# Patient Record
Sex: Female | Born: 1960 | Race: Black or African American | Hispanic: No | Smoking: Never smoker
Health system: Southern US, Community
[De-identification: ages and names within clinical notes are randomized; demographics above are authoritative.]

## PROBLEM LIST (undated history)

## (undated) DIAGNOSIS — D649 Anemia, unspecified: Secondary | ICD-10-CM

## (undated) DIAGNOSIS — I10 Essential (primary) hypertension: Secondary | ICD-10-CM

## (undated) DIAGNOSIS — I509 Heart failure, unspecified: Secondary | ICD-10-CM

## (undated) HISTORY — DX: Anemia, unspecified: D64.9

## (undated) HISTORY — DX: Heart failure, unspecified: I50.9

---

## 2004-11-17 ENCOUNTER — Emergency Department: Payer: Self-pay | Admitting: Emergency Medicine

## 2005-07-15 ENCOUNTER — Other Ambulatory Visit: Payer: Self-pay

## 2005-07-15 ENCOUNTER — Emergency Department: Payer: Self-pay | Admitting: Emergency Medicine

## 2005-07-31 ENCOUNTER — Emergency Department: Payer: Self-pay | Admitting: Emergency Medicine

## 2006-04-20 ENCOUNTER — Emergency Department: Payer: Self-pay | Admitting: General Practice

## 2006-06-10 ENCOUNTER — Ambulatory Visit: Payer: Self-pay | Admitting: Family Medicine

## 2007-02-14 ENCOUNTER — Emergency Department: Payer: Self-pay | Admitting: Emergency Medicine

## 2007-11-20 ENCOUNTER — Emergency Department: Payer: Self-pay | Admitting: Emergency Medicine

## 2009-05-11 ENCOUNTER — Ambulatory Visit: Payer: Self-pay | Admitting: Family Medicine

## 2009-06-11 ENCOUNTER — Emergency Department: Payer: Self-pay | Admitting: Emergency Medicine

## 2010-03-25 ENCOUNTER — Emergency Department: Payer: Self-pay | Admitting: Emergency Medicine

## 2010-07-14 ENCOUNTER — Emergency Department: Payer: Self-pay | Admitting: Emergency Medicine

## 2011-09-26 ENCOUNTER — Emergency Department: Payer: Self-pay | Admitting: Emergency Medicine

## 2011-09-26 LAB — BASIC METABOLIC PANEL
Anion Gap: 10 (ref 7–16)
BUN: 13 mg/dL (ref 7–18)
Co2: 26 mmol/L (ref 21–32)
EGFR (Non-African Amer.): 58 — ABNORMAL LOW
Osmolality: 280 (ref 275–301)

## 2011-09-26 LAB — CK TOTAL AND CKMB (NOT AT ARMC)
CK, Total: 155 U/L (ref 21–215)
CK, Total: 147 U/L (ref 21–215)
CK-MB: 1.3 ng/mL (ref 0.5–3.6)
CK-MB: 1 ng/mL (ref 0.5–3.6)

## 2011-09-26 LAB — CBC
HCT: 33.2 % — ABNORMAL LOW (ref 35.0–47.0)
HGB: 11.2 g/dL — ABNORMAL LOW (ref 12.0–16.0)
MCV: 92 fL (ref 80–100)
Platelet: 282 10*3/uL (ref 150–440)
RBC: 3.6 10*6/uL — ABNORMAL LOW (ref 3.80–5.20)

## 2012-10-29 ENCOUNTER — Ambulatory Visit: Payer: Self-pay | Admitting: Family Medicine

## 2013-06-03 ENCOUNTER — Ambulatory Visit: Payer: Self-pay | Admitting: Hematology and Oncology

## 2013-11-10 ENCOUNTER — Emergency Department: Payer: Self-pay | Admitting: Internal Medicine

## 2013-11-10 LAB — CBC
HCT: 30.6 % — ABNORMAL LOW (ref 35.0–47.0)
HGB: 10 g/dL — ABNORMAL LOW (ref 12.0–16.0)
MCH: 30.4 pg (ref 26.0–34.0)
MCHC: 32.5 g/dL (ref 32.0–36.0)
MCV: 94 fL (ref 80–100)
Platelet: 294 10*3/uL (ref 150–440)
RBC: 3.28 10*6/uL — ABNORMAL LOW (ref 3.80–5.20)
RDW: 15.1 % — AB (ref 11.5–14.5)
WBC: 7.4 10*3/uL (ref 3.6–11.0)

## 2013-11-10 LAB — BASIC METABOLIC PANEL
Anion Gap: 7 (ref 7–16)
BUN: 11 mg/dL (ref 7–18)
Calcium, Total: 8.9 mg/dL (ref 8.5–10.1)
Chloride: 104 mmol/L (ref 98–107)
Co2: 26 mmol/L (ref 21–32)
Creatinine: 1.22 mg/dL (ref 0.60–1.30)
EGFR (African American): 59 — ABNORMAL LOW
GFR CALC NON AF AMER: 51 — AB
Glucose: 90 mg/dL (ref 65–99)
OSMOLALITY: 273 (ref 275–301)
POTASSIUM: 3.5 mmol/L (ref 3.5–5.1)
Sodium: 137 mmol/L (ref 136–145)

## 2013-11-10 LAB — URINALYSIS, COMPLETE
BILIRUBIN, UR: NEGATIVE
BLOOD: NEGATIVE
GLUCOSE, UR: NEGATIVE mg/dL (ref 0–75)
KETONE: NEGATIVE
LEUKOCYTE ESTERASE: NEGATIVE
Nitrite: NEGATIVE
Ph: 6 (ref 4.5–8.0)
Protein: NEGATIVE
RBC, UR: NONE SEEN /HPF (ref 0–5)
Specific Gravity: 1.015 (ref 1.003–1.030)
Squamous Epithelial: 6
WBC UR: 1 /HPF (ref 0–5)

## 2013-11-10 LAB — TROPONIN I: Troponin-I: <0.02 ng/mL

## 2013-12-25 ENCOUNTER — Emergency Department: Payer: Self-pay | Admitting: Emergency Medicine

## 2014-01-01 ENCOUNTER — Emergency Department: Payer: Self-pay | Admitting: Emergency Medicine

## 2014-01-03 LAB — BETA STREP CULTURE(ARMC)

## 2015-10-09 ENCOUNTER — Ambulatory Visit
Admission: RE | Admit: 2015-10-09 | Discharge: 2015-10-09 | Disposition: A | Discharge status: Home / Self Care | Payer: Self-pay | Source: Ambulatory Visit | Attending: Family Medicine | Admitting: Family Medicine

## 2015-10-09 ENCOUNTER — Other Ambulatory Visit (HOSPITAL_COMMUNITY): Payer: Self-pay | Admitting: Family Medicine

## 2015-10-09 DIAGNOSIS — R7611 Nonspecific reaction to tuberculin skin test without active tuberculosis: Secondary | ICD-10-CM | POA: Insufficient documentation

## 2016-06-22 ENCOUNTER — Encounter: Payer: Self-pay | Admitting: Emergency Medicine

## 2016-06-22 DIAGNOSIS — J069 Acute upper respiratory infection, unspecified: Secondary | ICD-10-CM | POA: Insufficient documentation

## 2016-06-22 DIAGNOSIS — I10 Essential (primary) hypertension: Secondary | ICD-10-CM | POA: Insufficient documentation

## 2016-06-22 NOTE — ED Triage Notes (Addendum)
Patient with complaint of cough, congestion and chills that started yesterday. Patient states that she did not take her blood pressure medications today.

## 2016-06-23 ENCOUNTER — Emergency Department
Admission: EM | Admit: 2016-06-23 | Discharge: 2016-06-23 | Disposition: A | Discharge status: Home / Self Care | Payer: Self-pay | Attending: Emergency Medicine | Admitting: Emergency Medicine

## 2016-06-23 DIAGNOSIS — J069 Acute upper respiratory infection, unspecified: Secondary | ICD-10-CM

## 2016-06-23 HISTORY — DX: Essential (primary) hypertension: I10

## 2016-06-23 MED ORDER — HYDROCOD POLST-CPM POLST ER 10-8 MG/5ML PO SUER
5.0000 mL | Freq: Once | ORAL | Status: AC
  Administered 2016-06-23: 5 mL via ORAL
  Filled 2016-06-23: qty 5

## 2016-06-23 MED ORDER — GUAIFENESIN-CODEINE 100-10 MG/5ML PO SOLN: 5.0000 mL | Freq: Four times a day (QID) | ORAL | 0 refills | Status: DC | PRN

## 2016-06-23 NOTE — ED Provider Notes (Signed)
[name] Emergency Department Provider Note  Time seen: 1:15 AM  I have reviewed the triage vital signs and the nursing notes.   HISTORY  Chief Complaint Cough; Chills; Generalized Body Aches; and Nasal Congestion    HPI Sara Reed is a 56 y.o. female with a past medical history of hypertension who presents to the emergency department with cough and congestion. According to the patient for the past 2 days she has been coughing, states tonight she has not been able to get any sleep due to the cough. States some mild discomfort in the lower ribs from coughing so much. Patient denies fever at home. Denies any trouble breathing. Patient states nasal congestion in addition to cough. States some body aches.  Past Medical History:  Diagnosis Date  . Hypertension     There are no active problems to display for this patient.   History reviewed. No pertinent surgical history.  Prior to Admission medications   Not on File    No Known Allergies  No family history on file.  Social History Social History  Substance Use Topics  . Smoking status: Never Smoker  . Smokeless tobacco: Never Used  . Alcohol use No    Review of Systems Constitutional: Negative for fever. ENT: Positive for congestion Cardiovascular: Negative for chest pain. Respiratory: Negative for shortness of breath. Positive for cough. Gastrointestinal: Negative for abdominal pain Neurological: Negative for headache 10-point ROS otherwise negative.  ____________________________________________   PHYSICAL EXAM:  VITAL SIGNS: ED Triage Vitals  Enc Vitals Group     BP 06/22/16 2334 (!) 182/105     Pulse Rate 06/22/16 2334 100     Resp 06/22/16 2334 18     Temp 06/22/16 2332 98.4 F (36.9 C)     Temp Source 06/22/16 2332 Oral     SpO2 06/22/16 2334 99 %     Weight 06/22/16 2331 211 lb (95.7 kg)     Height 06/22/16 2331 5\' 7"  (1.702 m)     Head Circumference --      Peak  Flow --      Pain Score 06/22/16 2332 10     Pain Loc --      Pain Edu? --      Excl. in GC? --     Constitutional: Alert and oriented. Well appearing and in no distress. Eyes: Normal exam ENT   Head: Normocephalic and atraumatic.   Mouth/Throat: Mucous membranes are moist. Cardiovascular: Normal rate, regular rhythm. No murmur Respiratory: Normal respiratory effort without tachypnea nor retractions. Breath sounds are clear  Gastrointestinal: Soft and nontender. No distention.   Musculoskeletal: Nontender with normal range of motion in all extremities.  Neurologic:  Normal speech and language. No gross focal neurologic deficits Skin:  Skin is warm, dry and intact.  Psychiatric: Mood and affect are normal.  ____________________________________________    INITIAL IMPRESSION / ASSESSMENT AND PLAN / ED COURSE  Pertinent labs & imaging results that were available during my care of the patient were reviewed by me and considered in my medical decision making (see chart for details).  Patient presents with symptoms of an upper respiratory infection likely viral illness. Patient's main concern is not being able to sleep due to cough. Patient has clear lung sounds, largely normal vitals in the emergency department. We will treat with Tussionex, and discharged with a codeine based cough medication. I discussed supportive care at home including Tylenol or Motrin as needed for any fever or discomfort  in addition to plenty of rest and fluids. Patient is agreeable to plan we'll follow up with a primary care doctor if needed in 2-3 days.  ____________________________________________   FINAL CLINICAL IMPRESSION(S) / ED DIAGNOSES  Upper Respiratory infection    Minna AntisKevin Fatime [place], MD 06/23/16 [phone]

## 2016-06-26 ENCOUNTER — Ambulatory Visit: Payer: Self-pay

## 2016-07-31 ENCOUNTER — Ambulatory Visit: Payer: Self-pay | Attending: Oncology

## 2016-09-04 ENCOUNTER — Emergency Department: Payer: Self-pay

## 2016-09-04 ENCOUNTER — Encounter: Payer: Self-pay | Admitting: Emergency Medicine

## 2016-09-04 ENCOUNTER — Emergency Department
Admission: EM | Admit: 2016-09-04 | Discharge: 2016-09-04 | Disposition: A | Discharge status: Home / Self Care | Payer: Self-pay | Attending: Emergency Medicine | Admitting: Emergency Medicine

## 2016-09-04 DIAGNOSIS — I10 Essential (primary) hypertension: Secondary | ICD-10-CM | POA: Insufficient documentation

## 2016-09-04 DIAGNOSIS — R0981 Nasal congestion: Secondary | ICD-10-CM | POA: Insufficient documentation

## 2016-09-04 DIAGNOSIS — J189 Pneumonia, unspecified organism: Secondary | ICD-10-CM | POA: Insufficient documentation

## 2016-09-04 MED ORDER — HYDROCOD POLST-CPM POLST ER 10-8 MG/5ML PO SUER: 5.0000 mL | Freq: Two times a day (BID) | ORAL | 0 refills | Status: DC | PRN

## 2016-09-04 MED ORDER — AZITHROMYCIN 250 MG PO TABS: ORAL_TABLET | ORAL | 0 refills | Status: AC

## 2016-09-04 MED ORDER — BENZONATATE 100 MG PO CAPS
200.0000 mg | ORAL_CAPSULE | Freq: Once | ORAL | Status: AC
  Administered 2016-09-04: 200 mg via ORAL
  Filled 2016-09-04: qty 2

## 2016-09-04 MED ORDER — AZITHROMYCIN 500 MG PO TABS
500.0000 mg | ORAL_TABLET | Freq: Once | ORAL | Status: AC
  Administered 2016-09-04: 500 mg via ORAL
  Filled 2016-09-04: qty 1

## 2016-09-04 NOTE — ED Triage Notes (Signed)
Patient ambulatory to triage with steady gait, without difficulty or distress noted; pt reports since Saturday has had cold symptoms; prod cough green sputum and congestion

## 2016-09-04 NOTE — ED Provider Notes (Addendum)
[facility] Emergency Department Provider Note    First MD Initiated Contact with Patient 09/04/16 [phone]     (approximate)  I have reviewed the triage vital signs and the nursing notes.   HISTORY  Chief Complaint Cough and Nasal Congestion    HPI Sara Reed is a 56 y.o. female presents with three-day history of productive cough (green sputum), subjective fevers and chills. Patient denies any chest discomfort. Patient does admit that she has been unable to sleep secondary to cough. Patient denies any known sick contact. Patient denies any previous history of pneumonia.   Past Medical History:  Diagnosis Date  . Hypertension     There are no active problems to display for this patient.   History reviewed. No pertinent surgical history.  Prior to Admission medications   Medication Sig Start Date End Date Taking? Authorizing Provider  guaiFENesin-codeine 100-10 MG/5ML syrup Take 5 mLs by mouth every 6 (six) hours as needed for cough. 06/23/16   Minna Antis, MD    Allergies Patient has no known allergies.  No family history on file.  Social History Social History  Substance Use Topics  . Smoking status: Never Smoker  . Smokeless tobacco: Never Used  . Alcohol use No    Review of Systems Constitutional: No fever/chills Eyes: No visual changes. ENT: No sore throat. Cardiovascular: Denies chest pain. Respiratory: Denies shortness of breath.Positive for cough Gastrointestinal: No abdominal pain.  No nausea, no vomiting.  No diarrhea.  No constipation. Genitourinary: Negative for dysuria. Musculoskeletal: Negative for back pain. Integumentary: Negative for rash. Neurological: Negative for headaches, focal weakness or numbness.   ____________________________________________   PHYSICAL EXAM:  VITAL SIGNS: ED Triage Vitals  Enc Vitals Group     BP 09/04/16 0326 (!) 197/100     Pulse Rate 09/04/16 0326 (!) 108     Resp  09/04/16 0326 20     Temp 09/04/16 0326 99.1 F (37.3 C)     Temp Source 09/04/16 0326 Oral     SpO2 09/04/16 0326 95 %     Weight 09/04/16 0325 212 lb (96.2 kg)     Height 09/04/16 0325 5\' 3"  (1.6 m)     Head Circumference --      Peak Flow --      Pain Score --      Pain Loc --      Pain Edu? --      Excl. in GC? --     Constitutional: Alert and oriented. Well appearing and in no acute distress.Actively coughing Eyes: Conjunctivae are normal. PERRL. EOMI. Head: Atraumatic. Mouth/Throat: Mucous membranes are moist.  Oropharynx non-erythematous. Neck: No stridor.   Cardiovascular: Normal rate, regular rhythm. Good peripheral circulation. Grossly normal heart sounds. Respiratory: Normal respiratory effort.  No retractions. Bibasilar rhonchi Gastrointestinal: Soft and nontender. No distention.  Musculoskeletal: No lower extremity tenderness nor edema. No gross deformities of extremities. Neurologic:  Normal speech and language. No gross focal neurologic deficits are appreciated.  Skin:  Skin is warm, dry and intact. No rash noted. Psychiatric: Mood and affect are normal. Speech and behavior are normal.   RADIOLOGY I, [place] BROWN, personally viewed and evaluated these images (plain radiographs) as part of my medical decision making, as well as reviewing the written report by the radiologist.  Dg Chest 2 View  Result Date: 09/04/2016 CLINICAL DATA:  Acute onset of productive cough and congestion. Initial encounter. EXAM: CHEST  2 VIEW COMPARISON:  Chest radiograph  performed 10/09/2015 FINDINGS: The lungs are well-aerated. Hazy bibasilar airspace opacities raise concern for pneumonia. No pleural effusion or pneumothorax is seen. The heart is normal in size; the mediastinal contour is within normal limits. No acute osseous abnormalities are seen. IMPRESSION: Hazy bibasilar airspace opacities raise concern for pneumonia. Electronically Signed   By: Roanna RaiderJeffery  Chang M.D.   On: 09/04/2016  03:47    ____________________________________________    Procedures   ____________________________________________   INITIAL IMPRESSION / ASSESSMENT AND PLAN / ED COURSE  Pertinent labs & imaging results that were available during my care of the patient were reviewed by me and considered in my medical decision making (see chart for details).  56 year old female presenting with productive cough fever concern for possible pneumonia such chest x-ray was performed which revealed bibasilar pneumonia. Patient given azithromycin and Tessalon in the emergency department will be prescribed same for home. Patient advised to return to the emergency department for any worsening of symptoms.      ____________________________________________  FINAL CLINICAL IMPRESSION(S) / ED DIAGNOSES  Final diagnoses:  Community acquired pneumonia, unspecified laterality     MEDICATIONS GIVEN DURING THIS VISIT:  Medications  benzonatate (TESSALON) capsule 200 mg (200 mg Oral Given 09/04/16 0636)  azithromycin (ZITHROMAX) tablet 500 mg (500 mg Oral Given 09/04/16 0636)     NEW OUTPATIENT MEDICATIONS STARTED DURING THIS VISIT:  New Prescriptions   No medications on file    Modified Medications   No medications on file    Discontinued Medications   No medications on file     Note:  This document was prepared using Dragon voice recognition software and may include unintentional dictation errors.    Darci CurrentBrown, [place] N, MD 09/04/16 [id number]    Darci CurrentBrown, [place] N, MD 09/04/16 [phone]

## 2017-06-22 ENCOUNTER — Other Ambulatory Visit: Payer: Self-pay

## 2017-06-22 ENCOUNTER — Inpatient Hospital Stay
Admission: EM | Admit: 2017-06-22 | Discharge: 2017-06-24 | DRG: 193 | Disposition: A | Discharge status: Home / Self Care | Payer: Self-pay | Attending: Internal Medicine | Admitting: Internal Medicine

## 2017-06-22 ENCOUNTER — Emergency Department: Payer: Self-pay

## 2017-06-22 ENCOUNTER — Encounter: Payer: Self-pay | Admitting: Emergency Medicine

## 2017-06-22 DIAGNOSIS — J9601 Acute respiratory failure with hypoxia: Secondary | ICD-10-CM | POA: Diagnosis present

## 2017-06-22 DIAGNOSIS — I160 Hypertensive urgency: Secondary | ICD-10-CM | POA: Diagnosis present

## 2017-06-22 DIAGNOSIS — R918 Other nonspecific abnormal finding of lung field: Secondary | ICD-10-CM

## 2017-06-22 DIAGNOSIS — E876 Hypokalemia: Secondary | ICD-10-CM | POA: Diagnosis not present

## 2017-06-22 DIAGNOSIS — Z201 Contact with and (suspected) exposure to tuberculosis: Secondary | ICD-10-CM | POA: Diagnosis present

## 2017-06-22 DIAGNOSIS — J129 Viral pneumonia, unspecified: Principal | ICD-10-CM | POA: Diagnosis present

## 2017-06-22 DIAGNOSIS — R0902 Hypoxemia: Secondary | ICD-10-CM

## 2017-06-22 DIAGNOSIS — R042 Hemoptysis: Secondary | ICD-10-CM

## 2017-06-22 DIAGNOSIS — Z7982 Long term (current) use of aspirin: Secondary | ICD-10-CM

## 2017-06-22 DIAGNOSIS — I10 Essential (primary) hypertension: Secondary | ICD-10-CM | POA: Diagnosis present

## 2017-06-22 LAB — SEDIMENTATION RATE: Sed Rate: 69 mm/hr — ABNORMAL HIGH (ref 0–30)

## 2017-06-22 LAB — COMPREHENSIVE METABOLIC PANEL
ALK PHOS: 80 U/L (ref 38–126)
ALT: 15 U/L (ref 14–54)
AST: 23 U/L (ref 15–41)
Albumin: 3.9 g/dL (ref 3.5–5.0)
Anion gap: 9 (ref 5–15)
BUN: 11 mg/dL (ref 6–20)
CALCIUM: 9.2 mg/dL (ref 8.9–10.3)
CO2: 23 mmol/L (ref 22–32)
Chloride: 107 mmol/L (ref 101–111)
Creatinine, Ser: 0.98 mg/dL (ref 0.44–1.00)
Glucose, Bld: 116 mg/dL — ABNORMAL HIGH (ref 65–99)
Potassium: 3.6 mmol/L (ref 3.5–5.1)
SODIUM: 139 mmol/L (ref 135–145)
Total Bilirubin: 0.6 mg/dL (ref 0.3–1.2)
Total Protein: 8.4 g/dL — ABNORMAL HIGH (ref 6.5–8.1)

## 2017-06-22 LAB — RESPIRATORY PANEL BY PCR

## 2017-06-22 LAB — URINALYSIS, COMPLETE (UACMP) WITH MICROSCOPIC
Bacteria, UA: NONE SEEN
Bilirubin Urine: NEGATIVE
Glucose, UA: NEGATIVE mg/dL
Hgb urine dipstick: NEGATIVE
Ketones, ur: NEGATIVE mg/dL
Leukocytes, UA: NEGATIVE
Nitrite: NEGATIVE
Protein, ur: NEGATIVE mg/dL
RBC / HPF: NONE SEEN RBC/hpf (ref 0–5)
Specific Gravity, Urine: 1.025 (ref 1.005–1.030)
WBC, UA: NONE SEEN WBC/hpf (ref 0–5)
pH: 7 (ref 5.0–8.0)

## 2017-06-22 LAB — EXPECTORATED SPUTUM ASSESSMENT W GRAM STAIN, RFLX TO RESP C

## 2017-06-22 LAB — CBC WITH DIFFERENTIAL/PLATELET
Basophils Absolute: 0.1 10*3/uL (ref 0–0.1)
Basophils Relative: 2 %
EOS ABS: 0.1 10*3/uL (ref 0–0.7)
Eosinophils Relative: 2 %
HCT: 35.4 % (ref 35.0–47.0)
HEMOGLOBIN: 12 g/dL (ref 12.0–16.0)
LYMPHS ABS: 1.6 10*3/uL (ref 1.0–3.6)
Lymphocytes Relative: 28 %
MCH: 30 pg (ref 26.0–34.0)
MCHC: 33.9 g/dL (ref 32.0–36.0)
MCV: 88.5 fL (ref 80.0–100.0)
MONOS PCT: 5 %
Monocytes Absolute: 0.3 10*3/uL (ref 0.2–0.9)
NEUTROS PCT: 63 %
Neutro Abs: 3.6 10*3/uL (ref 1.4–6.5)
Platelets: 316 10*3/uL (ref 150–440)
RBC: 4 MIL/uL (ref 3.80–5.20)
RDW: 16 % — ABNORMAL HIGH (ref 11.5–14.5)
WBC: 5.6 10*3/uL (ref 3.6–11.0)

## 2017-06-22 LAB — EXPECTORATED SPUTUM ASSESSMENT W REFEX TO RESP CULTURE

## 2017-06-22 LAB — TROPONIN I: Troponin I: <0.03 ng/mL (ref <0.03)

## 2017-06-22 LAB — C-REACTIVE PROTEIN: CRP: <0.8 mg/dL (ref <1.0)

## 2017-06-22 LAB — BRAIN NATRIURETIC PEPTIDE: B Natriuretic Peptide: 81 pg/mL (ref 0.0–100.0)

## 2017-06-22 LAB — PROTIME-INR
INR: 0.98
PROTHROMBIN TIME: 12.9 s (ref 11.4–15.2)

## 2017-06-22 LAB — APTT: APTT: 32 s (ref 24–36)

## 2017-06-22 MED ORDER — SODIUM CHLORIDE 0.9 % IV SOLN
500.0000 mg | INTRAVENOUS | Status: DC
  Administered 2017-06-23: 500 mg via INTRAVENOUS
  Filled 2017-06-22: qty 500

## 2017-06-22 MED ORDER — FUROSEMIDE 10 MG/ML IJ SOLN
40.0000 mg | Freq: Once | INTRAMUSCULAR | Status: AC
  Administered 2017-06-22: 40 mg via INTRAVENOUS
  Filled 2017-06-22: qty 4

## 2017-06-22 MED ORDER — ONDANSETRON HCL 4 MG/2ML IJ SOLN: 4.0000 mg | Freq: Four times a day (QID) | INTRAMUSCULAR | Status: DC | PRN

## 2017-06-22 MED ORDER — LEVOFLOXACIN IN D5W 750 MG/150ML IV SOLN
750.0000 mg | Freq: Once | INTRAVENOUS | Status: AC
  Administered 2017-06-22: 750 mg via INTRAVENOUS
  Filled 2017-06-22: qty 150

## 2017-06-22 MED ORDER — POTASSIUM CHLORIDE 20 MEQ PO PACK
40.0000 meq | PACK | Freq: Once | ORAL | Status: AC
  Administered 2017-06-22: 40 meq via ORAL
  Filled 2017-06-22: qty 2

## 2017-06-22 MED ORDER — IPRATROPIUM-ALBUTEROL 0.5-2.5 (3) MG/3ML IN SOLN
3.0000 mL | Freq: Three times a day (TID) | RESPIRATORY_TRACT | Status: DC
  Administered 2017-06-22 – 2017-06-24 (×5): 3 mL via RESPIRATORY_TRACT
  Filled 2017-06-22 (×6): qty 3

## 2017-06-22 MED ORDER — HYDROCHLOROTHIAZIDE 25 MG PO TABS
25.0000 mg | ORAL_TABLET | Freq: Every day | ORAL | Status: DC
  Administered 2017-06-22 – 2017-06-24 (×3): 25 mg via ORAL
  Filled 2017-06-22 (×3): qty 1

## 2017-06-22 MED ORDER — SODIUM CHLORIDE 0.9 % IV SOLN
1.0000 g | INTRAVENOUS | Status: DC
  Administered 2017-06-23 – 2017-06-24 (×2): 1 g via INTRAVENOUS
  Filled 2017-06-22 (×2): qty 10

## 2017-06-22 MED ORDER — LISINOPRIL 20 MG PO TABS
20.0000 mg | ORAL_TABLET | Freq: Every day | ORAL | Status: DC
  Administered 2017-06-22 – 2017-06-24 (×3): 20 mg via ORAL
  Filled 2017-06-22 (×3): qty 1

## 2017-06-22 MED ORDER — ONDANSETRON HCL 4 MG PO TABS: 4.0000 mg | ORAL_TABLET | Freq: Four times a day (QID) | ORAL | Status: DC | PRN

## 2017-06-22 MED ORDER — IOPAMIDOL (ISOVUE-370) INJECTION 76%
75.0000 mL | Freq: Once | INTRAVENOUS | Status: AC | PRN
  Administered 2017-06-22: 75 mL via INTRAVENOUS

## 2017-06-22 MED ORDER — ORAL CARE MOUTH RINSE
15.0000 mL | Freq: Two times a day (BID) | OROMUCOSAL | Status: DC
  Administered 2017-06-22 (×2): 15 mL via OROMUCOSAL

## 2017-06-22 MED ORDER — BUDESONIDE 0.5 MG/2ML IN SUSP
0.5000 mg | Freq: Two times a day (BID) | RESPIRATORY_TRACT | Status: DC
Start: 2017-06-22 — End: 2017-06-24
  Administered 2017-06-22 – 2017-06-24 (×4): 0.5 mg via RESPIRATORY_TRACT
  Filled 2017-06-22 (×4): qty 2

## 2017-06-22 MED ORDER — ACETAMINOPHEN 325 MG PO TABS: 650.0000 mg | ORAL_TABLET | Freq: Four times a day (QID) | ORAL | Status: DC | PRN

## 2017-06-22 MED ORDER — SODIUM CHLORIDE 0.9 % IV SOLN
2.0000 g | Freq: Once | INTRAVENOUS | Status: DC
  Filled 2017-06-22: qty 2

## 2017-06-22 MED ORDER — HYDRALAZINE HCL 20 MG/ML IJ SOLN
10.0000 mg | INTRAMUSCULAR | Status: DC | PRN
  Administered 2017-06-22 – 2017-06-24 (×4): 10 mg via INTRAVENOUS
  Filled 2017-06-22 (×4): qty 1

## 2017-06-22 MED ORDER — HYDROCODONE-ACETAMINOPHEN 5-325 MG PO TABS
1.0000 | ORAL_TABLET | ORAL | Status: DC | PRN
Start: 2017-06-22 — End: 2017-06-24

## 2017-06-22 MED ORDER — ACETAMINOPHEN 650 MG RE SUPP: 650.0000 mg | Freq: Four times a day (QID) | RECTAL | Status: DC | PRN

## 2017-06-22 MED ORDER — BISOPROLOL-HYDROCHLOROTHIAZIDE 10-6.25 MG PO TABS
1.0000 | ORAL_TABLET | Freq: Every day | ORAL | Status: DC
  Administered 2017-06-22: 1 via ORAL
  Filled 2017-06-22 (×2): qty 1

## 2017-06-22 MED ORDER — POLYETHYLENE GLYCOL 3350 17 G PO PACK: 17.0000 g | PACK | Freq: Every day | ORAL | Status: DC | PRN

## 2017-06-22 MED ORDER — IPRATROPIUM-ALBUTEROL 0.5-2.5 (3) MG/3ML IN SOLN
3.0000 mL | Freq: Four times a day (QID) | RESPIRATORY_TRACT | Status: DC
  Administered 2017-06-22: 3 mL via RESPIRATORY_TRACT
  Filled 2017-06-22: qty 3

## 2017-06-22 MED ORDER — LISINOPRIL-HYDROCHLOROTHIAZIDE 20-25 MG PO TABS: 1.0000 | ORAL_TABLET | Freq: Every day | ORAL | Status: DC

## 2017-06-22 MED ORDER — BLOOD PRESSURE CONTROL BOOK: Freq: Once | Status: DC

## 2017-06-22 NOTE — Consult Note (Addendum)
[facility] Critical Care Medicine Consultation   ASSESSMENT/PLAN   Hemoptysis.   Scant hemoptysis noted with blood intermixed with sputum. CT scan and chest x-ray reviewed reveals bilateral predominantly alveolar process, lower lobe predominant and not bronchiolocentric. This makes diseases such as LCH less likely as patient is a nonsmoker and also no nodularity associated, hypersensitivity pneumonitis less likely, patient doesn't have demonstrable exposure, also distribution pattern on CT scan would be atypical for hypersensitivity, no crazy paving noted, less likely pulmonary alveolar proteinosis, alveolar sarcoid possible. No component of fibrosis is noted. Acute eosinophilic pneumonia can present with groundglass without peripheral eosinophilia. If no clear etiology at some point patient may require bronchoscopy with bronchoalveolar lavage for both culture data and quantitative cells.   In the setting of relative acute occurrence, no clear physical exam with findings consistent with congestive heart failure, no history of arthritis, no hematuria, no sinus disease is most likely infectious in nature with bilateral alveolar pneumonia. Would place on broad-spectrum coverage to include either macrolide or quinolone, respiratory viral panel, agree with ruling out for TB although much less likely to be tuberculous infection. Would obtain connective tissue workup to include ANA, ANCA, extractable nuclear antigens, rheumatoid factor, CCP, sedimentation rate, urinary sediment evaluating for casts, peripheral eosinophilia, quanteferon, echocardiography  Name: Sara Reed MRN: [id number] DOB: February 26, 1961    ADMISSION DATE:  06/22/2017 CONSULTATION DATE:  06/22/2017  REFERRING MD :  Dr. Katheren Shams  CHIEF COMPLAINT:  Coughing up blood   HISTORY OF PRESENT ILLNESS:  Sara Reed is a 57 year old female with past medical history remarkable for hypertension who has had illness for approximately 2 weeks off  time. 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 She has had cough over a two-week, she denied any fever, chills, sweats, denies any shortness of breath or chest pain. She denies any unintended weight loss, no pet or animal exposure or occupational exposures other than possibly sick contacts. This morning began coughing up streaks of blood mixed in with her sputum. Arrived to the emergency department, patient was hypoxemic, placed in isolation, started on supplemental oxygen. She feels well at this time is just concerned about her coughing up blood. Specifically on questioning patient does not have any bird exposure, denies any arthritis, states she does not have blood in her urine. Also denies sinus disease  PAST MEDICAL HISTORY :  Past Medical History:  Diagnosis Date  . Hypertension    History reviewed. No pertinent surgical history. Prior to Admission medications   Medication Sig Start Date End Date Taking? Authorizing Provider  aspirin EC 81 MG tablet Take 81 mg by mouth daily.   Yes [provider]   No Known Allergies  FAMILY HISTORY:  No family history on file. SOCIAL HISTORY:  reports that  has never smoked. she has never used smokeless tobacco. She reports that she does not drink alcohol or use drugs.  REVIEW OF SYSTEMS:   Constitutional: Feels well. Cardiovascular: No chest pain.  Pulmonary: Denies dyspnea.   The remainder of systems were reviewed and were found to be negative other than what is documented in the HPI.    VITAL SIGNS: Temp:  [98.4 F (36.9 C)] 98.4 F (36.9 C) (02/24 0725) Pulse Rate:  [88-114] 98 (02/24 1030) Resp:  [17-20] 17 (02/24 1030) BP: (178-212)/(109-128) 178/128 (02/24 1030) SpO2:  [86 %-100 %] 100 % (02/24 1030) Weight:  [90.7 kg (200 lb)] 90.7 kg (200 lb) (02/24 0726) HEMODYNAMICS:   INTAKE /  OUTPUT: No intake or output data in the 24 hours ending 06/22/17 1124  Physical Examination:   VS: BP (!)  178/128   Pulse 98   Temp 98.4 F (36.9 C) (Oral)   Resp 17   Ht 5\' 5"  (1.651 m)   Wt 90.7 kg (200 lb)   LMP 03/20/2017 (Approximate)   SpO2 100%   BMI 33.28 kg/m   General Appearance: No distress  Neuro:without focal findings, mental status, speech normal,. HEENT: PERRLA, EOM intact, no ptosis, no other lesions noticed;  Pulmonary: normal breath sounds., diaphragmatic excursion normal. CardiovascularNormal S1,S2.  No m/r/g.    Abdomen: Benign, Soft, non-tender, No masses, hepatosplenomegaly, No lymphadenopathy Skin:   warm, no rashes, no ecchymosis  Extremities: normal, no cyanosis, clubbing, no edema, warm with normal capillary refill.    LABS: Reviewed   LABORATORY PANEL:   CBC Recent Labs  Lab 06/22/17 0739  WBC 5.6  HGB 12.0  HCT 35.4  PLT 316    Chemistries  Recent Labs  Lab 06/22/17 0739  NA 139  K 3.6  CL 107  CO2 23  GLUCOSE 116*  BUN 11  CREATININE 0.98  CALCIUM 9.2  AST 23  ALT 15  ALKPHOS 80  BILITOT 0.6    No results for input(s): GLUCAP in the last 168 hours. No results for input(s): PHART, PCO2ART, PO2ART in the last 168 hours. Recent Labs  Lab 06/22/17 0739  AST 23  ALT 15  ALKPHOS 80  BILITOT 0.6  ALBUMIN 3.9    Cardiac Enzymes Recent Labs  Lab 06/22/17 0739  TROPONINI <0.03    RADIOLOGY:  Dg Chest 1 View  Result Date: 06/22/2017 CLINICAL DATA:  Cough and shortness of breath EXAM: CHEST 1 VIEW COMPARISON:  Sep 04, 2016 FINDINGS: Mild cardiomegaly. The hila and mediastinum are unchanged. Mild interstitial prominence suggests pulmonary venous congestion. No focal infiltrate or overt edema. IMPRESSION: Possible mild pulmonary venous congestion. No overt edema or focal infiltrate. Electronically Signed   By: Gerome Samavid  Williams III M.D   On: 06/22/2017 07:54   Ct Angio Chest Pe W And/or Wo Contrast  Result Date: 06/22/2017 CLINICAL DATA:  Hemoptysis.  Hypoxia. EXAM: CT ANGIOGRAPHY CHEST WITH CONTRAST TECHNIQUE: Multidetector CT  imaging of the chest was performed using the standard protocol during bolus administration of intravenous contrast. Multiplanar CT image reconstructions and MIPs were obtained to evaluate the vascular anatomy. CONTRAST:  75mL ISOVUE-370 IOPAMIDOL (ISOVUE-370) INJECTION 76% COMPARISON:  Chest radiograph from earlier today. FINDINGS: Cardiovascular: The study is high quality for the evaluation of pulmonary embolism. There are no filling defects in the central, lobar, segmental or subsegmental pulmonary artery branches to suggest acute pulmonary embolism. Great vessels are normal in course and caliber. Borderline mild cardiomegaly. No significant pericardial fluid/thickening. Mediastinum/Nodes: No discrete thyroid nodules. Unremarkable esophagus. No pathologically enlarged axillary, mediastinal or hilar lymph nodes. Coarsely calcified nonenlarged subcarinal and right hilar nodes from prior granulomatous disease. Lungs/Pleura: No pneumothorax. No pleural effusion. No acute consolidative airspace disease, lung masses or significant pulmonary nodules. Extensive patchy ground-glass opacities are noted throughout the bilateral dependent lungs, involving all lung lobes, most prominent in the lower lobes. Upper abdomen: Small hiatal hernia. Musculoskeletal: No aggressive appearing focal osseous lesions. Mild thoracic spondylosis. Review of the MIP images confirms the above findings. IMPRESSION: 1. No pulmonary embolism. 2. Extensive patchy ground-glass opacities throughout the dependent lungs bilaterally involving all lung lobes, most prominent in the lower lobes. Differential is broad and includes diffuse alveolar hemorrhage such as  from vasculitis, aspiration pneumonitis or pulmonary edema given the borderline mild cardiomegaly. 3. Small hiatal hernia. Electronically Signed   By: Delbert Phenix M.D.   On: 06/22/2017 09:42     Tora Kindred, DO  06/22/2017, 11:24 AM

## 2017-06-22 NOTE — ED Notes (Signed)
Tissue test completed-positive

## 2017-06-22 NOTE — ED Notes (Signed)
Returned from CT.

## 2017-06-22 NOTE — Progress Notes (Signed)
Patient's daughter brought in one of the patients home meds for review.  She said she takes another medicine that starts with a M.  I asked her if it was metoprolol and she said yes but she was not sure of the dose.  I asked her daughter if she could bring that bottle in tomorrow.  Home bottle brought in said zestoretic.  We are currently giving her ziac.  Dr Cherlynn KaiserSainani on call and notified.  He is changing her order here from ziac to zestorectic

## 2017-06-22 NOTE — H&P (Signed)
[employer] [facility] [place] [facility]   PATIENT NAME: Sara Reed    MR#:  [id number]  DATE OF BIRTH:  08-Dec-1960  DATE OF ADMISSION:  06/22/2017  PRIMARY CARE PHYSICIAN: Hillery Aldo, MD   REQUESTING/REFERRING PHYSICIAN:   CHIEF COMPLAINT:   Chief Complaint  Patient presents with  . Hemoptysis    HISTORY OF PRESENT ILLNESS: Sara Reed  is a 57 y.o. female with a known history per below, works as a Copy in the school system, presents with 1-2-week history of productive cough, developed coughing of blood this morning, ER workup noted for tachycardia, hypoxia, uncontrolled hypertension with systolic blood pressure in the 180s 190s range over 120, BNP was normal, CT chest noted for questionable hemorrhage/vasculitis/aspiration pneumonia versus edema, chest x-ray questionable for mild edema, ED attending did discuss case with Dr. Conforti/pulmonology-recommended broad-spectrum antibiotics and vasculitis workup, patient evaluated bedside, family present, patient denies having any fevers, chills, night sweats, rhinorrhea, sore throat, patient did have myalgias/joint aches for short time, does have a family history with a sister with asthma, but patient denies any wheezing/chest tightness, patient is now being admitted for acute hypoxic respiratory failure, hypertensive urgency, acute hemoptysis.  PAST MEDICAL HISTORY:   Past Medical History:  Diagnosis Date  . Hypertension     PAST SURGICAL HISTORY: None  SOCIAL HISTORY:  Social History   Tobacco Use  . Smoking status: Never Smoker  . Smokeless tobacco: Never Used  Substance Use Topics  . Alcohol use: No    FAMILY HISTORY: DM, II, asthma - sister  DRUG ALLERGIES: NKDA  REVIEW OF SYSTEMS:   CONSTITUTIONAL: No fever, fatigue or weakness.  EYES: No blurred or double vision.  EARS, NOSE, AND THROAT: No tinnitus or ear pain.  RESPIRATORY: + cough,no shortness of breath, wheezing or hemoptysis.   CARDIOVASCULAR: No chest pain, orthopnea, edema.  GASTROINTESTINAL: No nausea, vomiting, diarrhea or abdominal pain.  GENITOURINARY: No dysuria, hematuria.  ENDOCRINE: No polyuria, nocturia,  HEMATOLOGY: No anemia, easy bruising or bleeding SKIN: No rash or lesion. MUSCULOSKELETAL: No joint pain or arthritis.   NEUROLOGIC: No tingling, numbness, weakness.  PSYCHIATRY: No anxiety or depression.   MEDICATIONS AT HOME:  Prior to Admission medications   Not on File      PHYSICAL EXAMINATION:   VITAL SIGNS: Blood pressure (!) 186/121, pulse 88, temperature 98.4 F (36.9 C), temperature source Oral, resp. rate 17, height 5\' 5"  (1.651 m), weight 90.7 kg (200 lb), last menstrual period 03/20/2017, SpO2 96 %.  GENERAL:  57 y.o.-year-old patient lying in the bed with no acute distress.  EYES: Pupils equal, round, reactive to light and accommodation. No scleral icterus. Extraocular muscles intact.  HEENT: Head atraumatic, normocephalic. Oropharynx and nasopharynx clear.  NECK:  Supple, no jugular venous distention. No thyroid enlargement, no tenderness.  LUNGS: Inspiratory Rales/crepitus up to mid back. No use of accessory muscles of respiration.  CARDIOVASCULAR: S1, S2 normal. No murmurs, rubs, or gallops.  ABDOMEN: Soft, nontender, nondistended. Bowel sounds present. No organomegaly or mass.  EXTREMITIES: No pedal edema, cyanosis, or clubbing.  NEUROLOGIC: Cranial nerves II through XII are intact. Muscle strength 5/5 in all extremities. Sensation intact. Gait not checked.  PSYCHIATRIC: The patient is alert and oriented x 3.  SKIN: No obvious rash, lesion, or ulcer.   LABORATORY PANEL:   CBC Recent Labs  Lab 06/22/17 0739  WBC 5.6  HGB 12.0  HCT 35.4  PLT 316  MCV 88.5  MCH 30.0  MCHC 33.9  RDW 16.0*  LYMPHSABS 1.6  MONOABS 0.3  EOSABS 0.1  BASOSABS 0.1    ------------------------------------------------------------------------------------------------------------------  Chemistries  Recent Labs  Lab 06/22/17 0739  NA 139  K 3.6  CL 107  CO2 23  GLUCOSE 116*  BUN 11  CREATININE 0.98  CALCIUM 9.2  AST 23  ALT 15  ALKPHOS 80  BILITOT 0.6   ------------------------------------------------------------------------------------------------------------------ estimated creatinine clearance is 72.2 mL/min (by C-G formula based on SCr of 0.98 mg/dL). ------------------------------------------------------------------------------------------------------------------ No results for input(s): TSH, T4TOTAL, T3FREE, THYROIDAB in the last 72 hours.  Invalid input(s): FREET3   Coagulation profile Recent Labs  Lab 06/22/17 0739  INR 0.98   ------------------------------------------------------------------------------------------------------------------- No results for input(s): DDIMER in the last 72 hours. -------------------------------------------------------------------------------------------------------------------  Cardiac Enzymes Recent Labs  Lab 06/22/17 0739  TROPONINI <0.03   ------------------------------------------------------------------------------------------------------------------ Invalid input(s): POCBNP  ---------------------------------------------------------------------------------------------------------------  Urinalysis    Component Value Date/Time   COLORURINE Yellow 11/10/2013 1947   APPEARANCEUR Clear 11/10/2013 1947   LABSPEC 1.015 11/10/2013 1947   PHURINE 6.0 11/10/2013 1947   GLUCOSEU Negative 11/10/2013 1947   HGBUR Negative 11/10/2013 1947   BILIRUBINUR Negative 11/10/2013 1947   KETONESUR Negative 11/10/2013 1947   PROTEINUR Negative 11/10/2013 1947   NITRITE Negative 11/10/2013 1947   LEUKOCYTESUR Negative 11/10/2013 1947     RADIOLOGY: Dg Chest 1 View  Result Date:  06/22/2017 CLINICAL DATA:  Cough and shortness of breath EXAM: CHEST 1 VIEW COMPARISON:  Sep 04, 2016 FINDINGS: Mild cardiomegaly. The hila and mediastinum are unchanged. Mild interstitial prominence suggests pulmonary venous congestion. No focal infiltrate or overt edema. IMPRESSION: Possible mild pulmonary venous congestion. No overt edema or focal infiltrate. Electronically Signed   By: Gerome Sam III M.D   On: 06/22/2017 07:54   Ct Angio Chest Pe W And/or Wo Contrast  Result Date: 06/22/2017 CLINICAL DATA:  Hemoptysis.  Hypoxia. EXAM: CT ANGIOGRAPHY CHEST WITH CONTRAST TECHNIQUE: Multidetector CT imaging of the chest was performed using the standard protocol during bolus administration of intravenous contrast. Multiplanar CT image reconstructions and MIPs were obtained to evaluate the vascular anatomy. CONTRAST:  75mL ISOVUE-370 IOPAMIDOL (ISOVUE-370) INJECTION 76% COMPARISON:  Chest radiograph from earlier today. FINDINGS: Cardiovascular: The study is high quality for the evaluation of pulmonary embolism. There are no filling defects in the central, lobar, segmental or subsegmental pulmonary artery branches to suggest acute pulmonary embolism. Great vessels are normal in course and caliber. Borderline mild cardiomegaly. No significant pericardial fluid/thickening. Mediastinum/Nodes: No discrete thyroid nodules. Unremarkable esophagus. No pathologically enlarged axillary, mediastinal or hilar lymph nodes. Coarsely calcified nonenlarged subcarinal and right hilar nodes from prior granulomatous disease. Lungs/Pleura: No pneumothorax. No pleural effusion. No acute consolidative airspace disease, lung masses or significant pulmonary nodules. Extensive patchy ground-glass opacities are noted throughout the bilateral dependent lungs, involving all lung lobes, most prominent in the lower lobes. Upper abdomen: Small hiatal hernia. Musculoskeletal: No aggressive appearing focal osseous lesions. Mild thoracic  spondylosis. Review of the MIP images confirms the above findings. IMPRESSION: 1. No pulmonary embolism. 2. Extensive patchy ground-glass opacities throughout the dependent lungs bilaterally involving all lung lobes, most prominent in the lower lobes. Differential is broad and includes diffuse alveolar hemorrhage such as from vasculitis, aspiration pneumonitis or pulmonary edema given the borderline mild cardiomegaly. 3. Small hiatal hernia. Electronically Signed   By: Delbert Phenix M.D.   On: 06/22/2017 09:42    EKG: Orders placed or performed during the hospital encounter of 06/22/17  . ED EKG  . ED EKG  .  EKG 12-Lead  . EKG 12-Lead  . EKG 12-Lead  . EKG 12-Lead    IMPRESSION AND PLAN: 1 acute hypoxic respiratory failure Secondary to unknown etiology CT chest noted for wide with differential that includes hemorrhage/vasculitis, aspiration pneumonia, edema Admit to regular nursing floor bed, pulmonology consulted for expert opinion, empiric Rocephin/azithromycin, check vasculitis workup-ANA/CRP/ANCA/ANA/urinalysis, check viral respiratory panel, sputum cultures, trial of Pulmicort twice daily with scheduled breathing treatments, Lasix IV x1, supplemental oxygen with weaning as tolerated  2 acute hemoptysis Etiology unknown, compounded by hypertensive urgency Plan of care as stated above  3 acute hypertensive urgency Lasix IV x1 with potassium replacement, Ziac, hydralazine as needed systolic blood pressure greater than 160, vitals per routine, make changes as per necessary  4 acute abnormal CT of the chest Plan of care as stated above  Disposition Home in 1-2 days barring any complications   all the records are reviewed and case discussed with ED provider. Management plans discussed with the patient, family and they are in agreement.  CODE STATUS:full Code Status History    This patient does not have a recorded code status. Please follow your organizational policy for patients in  this situation.       TOTAL TIME TAKING CARE OF THIS PATIENT: 45 minutes.    Evelena AsaMontell D Salary M.D on 06/22/2017   Between 7am to 6pm - Pager - [phone]  After 6pm go to [email or web address] - password [email or web address]  [facility]  Office  [phone]  CC: Primary care physician; Hillery AldoPatel, Sarah, MD   Note: This dictation was prepared with Dragon dictation along with smaller phrase technology. Any transcriptional errors that result from this process are unintentional.

## 2017-06-22 NOTE — ED Triage Notes (Signed)
Pt reports cold sx's for the past week and started coughing up blood this am. Pt actively coughing in triage blood tinged sputum.

## 2017-06-22 NOTE — ED Provider Notes (Signed)
[facility] Emergency Department Provider Note  ___________________________________________   First MD Initiated Contact with Patient 06/22/17 [phone]     (approximate)  I have reviewed the triage vital signs and the nursing notes.   HISTORY  Chief Complaint Hemoptysis  HPI Sara Reed is a 57 y.o. female with a history of hypertension was presenting to the emergency department 2 weeks of cough.  She is denying any chest pain.  Denies any shortness of breath.  However, as of this morning she is coughing up pink, bloody sputum.  She says that she is compliant with her blood pressure medications.  Says that she also hears a "wheezing" from her chest.  Denies being on blood thinners.  Denies nosebleeds.  Past Medical History:  Diagnosis Date  . Hypertension     There are no active problems to display for this patient.   History reviewed. No pertinent surgical history.  Prior to Admission medications   Medication Sig Start Date End Date Taking? Authorizing Provider  chlorpheniramine-HYDROcodone (TUSSIONEX PENNKINETIC ER) 10-8 MG/5ML SUER Take 5 mLs by mouth every 12 (twelve) hours as needed. 09/04/16   Darci CurrentBrown, Reliance N, MD  guaiFENesin-codeine 100-10 MG/5ML syrup Take 5 mLs by mouth every 6 (six) hours as needed for cough. 06/23/16   Minna AntisPaduchowski, Kevin, MD    Allergies Patient has no known allergies.  No family history on file.  Social History Social History   Tobacco Use  . Smoking status: Never Smoker  . Smokeless tobacco: Never Used  Substance Use Topics  . Alcohol use: No  . Drug use: No    Review of Systems  Constitutional: No fever/chills Eyes: No visual changes. ENT: No sore throat. Cardiovascular: Denies chest pain. Respiratory: As above Gastrointestinal: No abdominal pain.  No nausea, no vomiting.  No diarrhea.  No constipation. Genitourinary: Negative for dysuria. Musculoskeletal: Negative for back pain. Skin: Negative for  rash. Neurological: Negative for headaches, focal weakness or numbness.   ____________________________________________   PHYSICAL EXAM:  VITAL SIGNS: ED Triage Vitals  Enc Vitals Group     BP 06/22/17 0725 (!) 212/109     Pulse Rate 06/22/17 0725 (!) 114     Resp 06/22/17 0725 20     Temp 06/22/17 0725 98.4 F (36.9 C)     Temp Source 06/22/17 0725 Oral     SpO2 06/22/17 0725 (!) 86 %     Weight 06/22/17 0726 200 lb (90.7 kg)     Height 06/22/17 0726 5\' 5"  (1.651 m)     Head Circumference --      Peak Flow --      Pain Score 06/22/17 0725 8     Pain Loc --      Pain Edu? --      Excl. in GC? --     Constitutional: Alert and oriented. Well appearing and in no acute distress. Eyes: Conjunctivae are normal.  Head: Atraumatic. Nose: No congestion/rhinnorhea.  No blood to the bilateral nares.  No obvious bleeding source visualized on the septum. Mouth/Throat: Mucous membranes are moist.  No blood in the posterior pharynx. Neck: No stridor.   Cardiovascular: Normal rate, regular rhythm. Grossly normal heart sounds.   Respiratory: Normal respiratory effort.  No retractions.  Mild rales from the bases to the mid fields bilaterally.  Patient coughed several times while I am in the room and produces a pink, blood-tinged sputum. Gastrointestinal: Soft and nontender. No distention.  Musculoskeletal: No lower extremity tenderness nor  edema.  No joint effusions. Neurologic:  Normal speech and language. No gross focal neurologic deficits are appreciated. Skin:  Skin is warm, dry and intact. No rash noted. Psychiatric: Mood and affect are normal. Speech and behavior are normal.  ____________________________________________   LABS (all labs ordered are listed, but only abnormal results are displayed)  Labs Reviewed  CBC WITH DIFFERENTIAL/PLATELET - Abnormal; Notable for the following components:      Result Value   RDW 16.0 (*)    All other components within normal limits    COMPREHENSIVE METABOLIC PANEL - Abnormal; Notable for the following components:   Glucose, Bld 116 (*)    Total Protein 8.4 (*)    All other components within normal limits  PROTIME-INR  APTT  TROPONIN I  BRAIN NATRIURETIC PEPTIDE   ____________________________________________  EKG  ED ECG REPORT I, Arelia Longest, the attending physician, personally viewed and interpreted this ECG.   Date: 06/22/2017  EKG Time: 0754  Rate: 101  Rhythm: sinus tachycardia  Axis: Normal  Intervals:none  ST&T Change: No ST segment elevation or depression.  No abnormal T wave inversion.  ____________________________________________  RADIOLOGY  Possible mild pulmonary venous congestion without overt edema or infiltrate  No PE.  CT shows extensive groundglass opacities.  Broad differential including diffuse alveolar hemorrhage, aspiration pneumonitis or pulmonary edema. ____________________________________________   PROCEDURES  Procedure(s) performed:   .Critical Care Performed by: Myrna Blazer, MD Authorized by: Myrna Blazer, MD   Critical care provider statement:    Critical care time (minutes):  35   Critical care was necessary to treat or prevent imminent or life-threatening deterioration of the following conditions:  Respiratory failure   Critical care was time spent personally by me on the following activities:  Examination of patient, ordering and performing treatments and interventions, ordering and review of laboratory studies, ordering and review of radiographic studies, pulse oximetry and re-evaluation of patient's condition    Critical Care performed:   ____________________________________________   INITIAL IMPRESSION / ASSESSMENT AND PLAN / ED COURSE  Pertinent labs & imaging results that were available during my care of the patient were reviewed by me and considered in my medical decision making (see chart for details).  Differential  includes, but is not limited to, viral syndrome, bronchitis including COPD exacerbation, pneumonia, reactive airway disease including asthma, CHF including exacerbation with or without pulmonary/interstitial edema, pneumothorax, ACS, thoracic trauma, and pulmonary embolism. Lupus, sarcoid, diffuse alveolar hemorrhage As part of my medical decision making, I reviewed the following data within the electronic MEDICAL RECORD NUMBER Notes from prior ED visits  ----------------------------------------- 10:26 AM on 06/22/2017 -----------------------------------------  I discussed the case with Dr. Lonn Georgia of pulmonary critical care.  He recommends antibiotics and lab panel for autoimmune/rheumatologic disorder.  He says that the pulmonary appearance is not consistent with tuberculosis.  I also discussed the possibility of TEE with the reading radiologist who says that the appearance is also not classic for TB.  Dr. Lonn Georgia also did not recommend steroids at this time.  Will be admitted to the hospitalist service.  Signed out to Dr. Katheren Shams.  Dr. Lonn Georgia requested antibiotics including quinolone or macrolide for atypical coverage.  ____________________________________________   FINAL CLINICAL IMPRESSION(S) / ED DIAGNOSES  Hypoxia.  Hemoptysis.  Ground glass opacities.    NEW MEDICATIONS STARTED DURING THIS VISIT:  New Prescriptions   No medications on file     Note:  This document was prepared using Dragon voice recognition software and may  include unintentional dictation errors.     Myrna Blazer, MD 06/22/17 1031

## 2017-06-23 ENCOUNTER — Other Ambulatory Visit: Payer: Self-pay | Admitting: *Deleted

## 2017-06-23 ENCOUNTER — Inpatient Hospital Stay
Admit: 2017-06-23 | Discharge: 2017-06-23 | Disposition: A | Discharge status: Home / Self Care | Payer: Self-pay | Attending: Family Medicine | Admitting: Family Medicine

## 2017-06-23 DIAGNOSIS — R918 Other nonspecific abnormal finding of lung field: Secondary | ICD-10-CM

## 2017-06-23 DIAGNOSIS — J129 Viral pneumonia, unspecified: Principal | ICD-10-CM

## 2017-06-23 LAB — CBC
HCT: 34.9 % — ABNORMAL LOW (ref 35.0–47.0)
HEMOGLOBIN: 11.6 g/dL — AB (ref 12.0–16.0)
MCH: 29.1 pg (ref 26.0–34.0)
MCHC: 33.2 g/dL (ref 32.0–36.0)
MCV: 87.6 fL (ref 80.0–100.0)
Platelets: 341 10*3/uL (ref 150–440)
RBC: 3.98 MIL/uL (ref 3.80–5.20)
RDW: 16 % — ABNORMAL HIGH (ref 11.5–14.5)
WBC: 7.5 10*3/uL (ref 3.6–11.0)

## 2017-06-23 LAB — URINE DRUG SCREEN, QUALITATIVE (ARMC ONLY)
Amphetamines, Ur Screen: NOT DETECTED
Barbiturates, Ur Screen: NOT DETECTED
Benzodiazepine, Ur Scrn: NOT DETECTED
Cannabinoid 50 Ng, Ur ~~LOC~~: NOT DETECTED
Cocaine Metabolite,Ur ~~LOC~~: NOT DETECTED
MDMA (Ecstasy)Ur Screen: NOT DETECTED
Methadone Scn, Ur: NOT DETECTED
Opiate, Ur Screen: NOT DETECTED
Phencyclidine (PCP) Ur S: NOT DETECTED
Tricyclic, Ur Screen: NOT DETECTED

## 2017-06-23 LAB — PATHOLOGIST SMEAR REVIEW

## 2017-06-23 LAB — RAPID HIV SCREEN (HIV 1/2 AB+AG)
HIV 1/2 Antibodies: NONREACTIVE
HIV-1 P24 Antigen - HIV24: NONREACTIVE

## 2017-06-23 MED ORDER — METHYLPREDNISOLONE SODIUM SUCC 125 MG IJ SOLR
60.0000 mg | Freq: Every day | INTRAMUSCULAR | Status: DC
  Administered 2017-06-23 – 2017-06-24 (×2): 60 mg via INTRAVENOUS
  Filled 2017-06-23 (×2): qty 2

## 2017-06-23 MED ORDER — AZITHROMYCIN 250 MG PO TABS
500.0000 mg | ORAL_TABLET | Freq: Every day | ORAL | Status: DC
  Administered 2017-06-24: 500 mg via ORAL
  Filled 2017-06-23: qty 2

## 2017-06-23 MED ORDER — SODIUM CHLORIDE 0.9% FLUSH
3.0000 mL | Freq: Two times a day (BID) | INTRAVENOUS | Status: DC
  Administered 2017-06-23 – 2017-06-24 (×2): 3 mL via INTRAVENOUS

## 2017-06-23 NOTE — Progress Notes (Signed)
[employer] [facility] [facility] [place] [facility]   PATIENT NAME: Sara Reed    MR#:  [id number]  DATE OF BIRTH:  Dec 29, 1960  SUBJECTIVE:  CHIEF COMPLAINT:   Chief Complaint  Patient presents with  . Hemoptysis   -Patient came in with coughing, dyspnea and hemoptysis. -Has minimal dry cough this morning. Her breathing has significantly improved. No further hemoptysis. -Airborne isolation has been placed to rule out tuberculosis.  REVIEW OF SYSTEMS:  Review of Systems  Constitutional: Negative for chills, fever and malaise/fatigue.  HENT: Negative for congestion, ear discharge, hearing loss and nosebleeds.   Eyes: Negative for blurred vision and double vision.  Respiratory: Positive for cough. Negative for shortness of breath and wheezing.   Cardiovascular: Negative for chest pain, palpitations and leg swelling.  Gastrointestinal: Negative for abdominal pain, constipation, diarrhea, nausea and vomiting.  Genitourinary: Negative for dysuria.  Musculoskeletal: Negative for myalgias.  Neurological: Negative for dizziness, speech change, focal weakness, seizures and headaches.  Psychiatric/Behavioral: Negative for depression.    DRUG ALLERGIES:  No Known Allergies  VITALS:  Blood pressure 129/84, pulse 84, temperature 98.1 F (36.7 C), temperature source Oral, resp. rate 14, height 5\' 5"  (1.651 m), weight 90.7 kg (200 lb), last menstrual period 03/20/2017, SpO2 99 %.  PHYSICAL EXAMINATION:  Physical Exam  GENERAL:  57 y.o.-year-old patient lying in the bed with no acute distress.  EYES: Pupils equal, round, reactive to light and accommodation. No scleral icterus. Extraocular muscles intact.  HEENT: Head atraumatic, normocephalic. Oropharynx and nasopharynx clear.  NECK:  Supple, no jugular venous distention. No thyroid enlargement, no tenderness.  LUNGS: Normal breath sounds bilaterally, no wheezing, rales or crepitation. Occasional scattered rhonchi at the bases. No  use of accessory muscles of respiration.  CARDIOVASCULAR: S1, S2 normal. No murmurs, rubs, or gallops.  ABDOMEN: Soft, nontender, nondistended. Bowel sounds present. No organomegaly or mass.  EXTREMITIES: No pedal edema, cyanosis, or clubbing.  NEUROLOGIC: Cranial nerves II through XII are intact. Muscle strength 5/5 in all extremities. Sensation intact. Gait not checked.  PSYCHIATRIC: The patient is alert and oriented x 3.  SKIN: No obvious rash, lesion, or ulcer.    LABORATORY PANEL:   CBC Recent Labs  Lab 06/23/17 0540  WBC 7.5  HGB 11.6*  HCT 34.9*  PLT 341   ------------------------------------------------------------------------------------------------------------------  Chemistries  Recent Labs  Lab 06/22/17 0739  NA 139  K 3.6  CL 107  CO2 23  GLUCOSE 116*  BUN 11  CREATININE 0.98  CALCIUM 9.2  AST 23  ALT 15  ALKPHOS 80  BILITOT 0.6   ------------------------------------------------------------------------------------------------------------------  Cardiac Enzymes Recent Labs  Lab 06/22/17 0739  TROPONINI <0.03   ------------------------------------------------------------------------------------------------------------------  RADIOLOGY:  Dg Chest 1 View  Result Date: 06/22/2017 CLINICAL DATA:  Cough and shortness of breath EXAM: CHEST 1 VIEW COMPARISON:  Sep 04, 2016 FINDINGS: Mild cardiomegaly. The hila and mediastinum are unchanged. Mild interstitial prominence suggests pulmonary venous congestion. No focal infiltrate or overt edema. IMPRESSION: Possible mild pulmonary venous congestion. No overt edema or focal infiltrate. Electronically Signed   By: Gerome Samavid  Williams III M.D   On: 06/22/2017 07:54   Ct Angio Chest Pe W And/or Wo Contrast  Result Date: 06/22/2017 CLINICAL DATA:  Hemoptysis.  Hypoxia. EXAM: CT ANGIOGRAPHY CHEST WITH CONTRAST TECHNIQUE: Multidetector CT imaging of the chest was performed using the standard protocol during bolus  administration of intravenous contrast. Multiplanar CT image reconstructions and MIPs were obtained to evaluate the vascular anatomy. CONTRAST:  75mL ISOVUE-370 IOPAMIDOL (ISOVUE-370) INJECTION 76% COMPARISON:  Chest radiograph from earlier today. FINDINGS: Cardiovascular: The study is high quality for the evaluation of pulmonary embolism. There are no filling defects in the central, lobar, segmental or subsegmental pulmonary artery branches to suggest acute pulmonary embolism. Great vessels are normal in course and caliber. Borderline mild cardiomegaly. No significant pericardial fluid/thickening. Mediastinum/Nodes: No discrete thyroid nodules. Unremarkable esophagus. No pathologically enlarged axillary, mediastinal or hilar lymph nodes. Coarsely calcified nonenlarged subcarinal and right hilar nodes from prior granulomatous disease. Lungs/Pleura: No pneumothorax. No pleural effusion. No acute consolidative airspace disease, lung masses or significant pulmonary nodules. Extensive patchy ground-glass opacities are noted throughout the bilateral dependent lungs, involving all lung lobes, most prominent in the lower lobes. Upper abdomen: Small hiatal hernia. Musculoskeletal: No aggressive appearing focal osseous lesions. Mild thoracic spondylosis. Review of the MIP images confirms the above findings. IMPRESSION: 1. No pulmonary embolism. 2. Extensive patchy ground-glass opacities throughout the dependent lungs bilaterally involving all lung lobes, most prominent in the lower lobes. Differential is broad and includes diffuse alveolar hemorrhage such as from vasculitis, aspiration pneumonitis or pulmonary edema given the borderline mild cardiomegaly. 3. Small hiatal hernia. Electronically Signed   By: Delbert Phenix M.D.   On: 06/22/2017 09:42    EKG:   Orders placed or performed during the hospital encounter of 06/22/17  . ED EKG  . ED EKG  . EKG 12-Lead  . EKG 12-Lead  . EKG 12-Lead  . EKG 12-Lead     ASSESSMENT AND PLAN:   57 year old female with past medical history significant for hypertension presents to hospital secondary to worsening cough, hemoptysis.  1. Acute pneumonitis-could be viral pneumonitis. Vasculitis cannot be ruled out with her hemoptysis. -Has minimal hemoptysis, none since admission. - ANA labs are pending. Patient does have a very remote history of exposure to tuberculosis. Hasn't been symptomatic, denies any weight loss or night sweats. -Airborne isolation has been placed until TB is ruled out. AFBs have been ordered but patient unable to produce any sputum. -If worsens, might need bronchoscopy and biopsy. Denies any allergies or history of asthma. -Pulmonary follow-up and ID consult to see if she needs airborne isolation. -Clinically improving. Currently on Rocephin and azithromycin. -Remains on room air  2. Hypertensive urgency-restart home blood pressure medications after verification. -Received 1 dose of Lasix. -Echocardiogram has been ordered to rule out diastolic dysfunction.  3. Hypertension-on lisinopril, hydrochlorothiazide. Verify home dose of metoprolol and restart  4. Hypokalemia-replaced  5. DVT prophylaxis-encourage ambulation.    All the records are reviewed and case discussed with Care Management/Social Workerr. Management plans discussed with the patient, family and they are in agreement.  CODE STATUS: Full code  TOTAL TIME TAKING CARE OF THIS PATIENT: 37 minutes.   POSSIBLE D/C IN 1-2 DAYS, DEPENDING ON CLINICAL CONDITION.   Cleburne Savini M.D on 06/23/2017 at 12:17 PM  Between 7am to 6pm - Pager - [phone]  After 6pm go to [email or web address] - Social research officer, government  [facility]  Office  [phone]  CC: Primary care physician; Hillery Aldo, MD

## 2017-06-23 NOTE — Progress Notes (Signed)
[facility]  Pulmonary Medicine     Assessment and Plan:  Viral pneumonia with hemoptysis. -Patient has groundglass changes present on CT chest, differential is broad, however in the current clinical context it appears most consistent with a resolving viral pneumonia. -Patient previously having hemoptysis, this appears to be resolving. - Patient will need outpatient follow-up with repeat CT scan in 6-8 weeks, with further workup if findings are not improved, my office will arrange repeat CT and office followup.    Date: 06/23/2017  MRN# [id number] Sara Reed 05-12-1960   Sara Reed is a 57 y.o. old female seen in follow up for chief complaint of  Chief Complaint  Patient presents with  . Hemoptysis     HPI:  The patient is feeling better, her hemoptysis has resolved.  Oxygen saturations currently 99% on room air.  Images from most recent CT chest were personally reviewed, this shows changes of dependent edema, groundglass changes.  Medication:    Current Facility-Administered Medications:  .  acetaminophen (TYLENOL) tablet 650 mg, 650 mg, Oral, Q6H PRN **OR** acetaminophen (TYLENOL) suppository 650 mg, 650 mg, Rectal, Q6H PRN, Salary, Montell D, MD .  Melene Muller ON 06/24/2017] azithromycin (ZITHROMAX) tablet 500 mg, 500 mg, Oral, Daily, Kalisetti, Radhika, MD .  blood pressure control book, , Does not apply, Once, Salary, Montell D, MD .  budesonide (PULMICORT) nebulizer solution 0.5 mg, 0.5 mg, Nebulization, BID, Salary, Montell D, MD, 0.5 mg at 06/23/17 0718 .  cefTRIAXone (ROCEPHIN) 1 g in sodium chloride 0.9 % 100 mL IVPB, 1 g, Intravenous, Q24H, Salary, Montell D, MD, Stopped at 06/23/17 1200 .  hydrALAZINE (APRESOLINE) injection 10 mg, 10 mg, Intravenous, Q4H PRN, Salary, Montell D, MD, 10 mg at 06/23/17 0546 .  lisinopril (PRINIVIL,ZESTRIL) tablet 20 mg, 20 mg, Oral, Daily, 20 mg at 06/23/17 1014 **AND** hydrochlorothiazide (HYDRODIURIL) tablet 25 mg, 25 mg, Oral,  Daily, Salary, Montell D, MD, 25 mg at 06/23/17 1014 .  HYDROcodone-acetaminophen (NORCO/VICODIN) 5-325 MG per tablet 1-2 tablet, 1-2 tablet, Oral, Q4H PRN, Salary, Montell D, MD .  ipratropium-albuterol (DUONEB) 0.5-2.5 (3) MG/3ML nebulizer solution 3 mL, 3 mL, Nebulization, TID, Salary, Montell D, MD, 3 mL at 06/23/17 1330 .  methylPREDNISolone sodium succinate (SOLU-MEDROL) 125 mg/2 mL injection 60 mg, 60 mg, Intravenous, Daily, Enid Baas, MD, 60 mg at 06/23/17 1025 .  ondansetron (ZOFRAN) tablet 4 mg, 4 mg, Oral, Q6H PRN **OR** ondansetron (ZOFRAN) injection 4 mg, 4 mg, Intravenous, Q6H PRN, Salary, Montell D, MD .  polyethylene glycol (MIRALAX / GLYCOLAX) packet 17 g, 17 g, Oral, Daily PRN, Salary, Montell D, MD   Allergies:  Patient has no known allergies.  Review of Systems: Gen:  Denies  fever, sweats. HEENT: Denies blurred vision. Cvc:  No dizziness, chest pain or heaviness Resp:   Denies cough or sputum porduction. Gi: Denies swallowing difficulty, stomach pain. constipation, bowel incontinence Gu:  Denies bladder incontinence, burning urine Ext:   No Joint pain, stiffness. Skin: No skin rash, easy bruising. Endoc:  No polyuria, polydipsia. Psych: No depression, insomnia. Other:  All other systems were reviewed and found to be negative other than what is mentioned in the HPI.   Physical Examination:   VS: BP 132/79 (BP Location: Right Arm)   Pulse 82   Temp 98 F (36.7 C) (Oral)   Resp 16   Ht 5\' 5"  (1.651 m)   Wt 200 lb (90.7 kg)   LMP 03/20/2017 (Approximate)   SpO2 99%  BMI 33.28 kg/m    General Appearance: No distress, comfortable, patient is speaking in full sentences without dyspnea. Neuro:without focal findings,  speech normal,  HEENT: PERRLA, EOM intact. Pulmonary: Scattered basal crackles.  No wheezing.   CardiovascularNormal S1,S2.  No m/r/g.   Abdomen: Benign, Soft, non-tender. Renal:  No costovertebral tenderness  GU:  Not performed at this  time. Endoc: No evident thyromegaly, no signs of acromegaly. Skin:   warm, no rash. Extremities: normal, no cyanosis, clubbing.   LABORATORY PANEL:   CBC Recent Labs  Lab 06/23/17 0540  WBC 7.5  HGB 11.6*  HCT 34.9*  PLT 341   ------------------------------------------------------------------------------------------------------------------  Chemistries  Recent Labs  Lab 06/22/17 0739  NA 139  K 3.6  CL 107  CO2 23  GLUCOSE 116*  BUN 11  CREATININE 0.98  CALCIUM 9.2  AST 23  ALT 15  ALKPHOS 80  BILITOT 0.6   ------------------------------------------------------------------------------------------------------------------  Cardiac Enzymes Recent Labs  Lab 06/22/17 0739  TROPONINI <0.03   ------------------------------------------------------------  RADIOLOGY:   No results found for this or any previous visit. Results for orders placed during the hospital encounter of 09/04/16  DG Chest 2 View   Narrative CLINICAL DATA:  Acute onset of productive cough and congestion. Initial encounter.  EXAM: CHEST  2 VIEW  COMPARISON:  Chest radiograph performed 10/09/2015  FINDINGS: The lungs are well-aerated. Hazy bibasilar airspace opacities raise concern for pneumonia. No pleural effusion or pneumothorax is seen.  The heart is normal in size; the mediastinal contour is within normal limits. No acute osseous abnormalities are seen.  IMPRESSION: Hazy bibasilar airspace opacities raise concern for pneumonia.   Electronically Signed   By: Roanna RaiderJeffery  Chang M.D.   On: 09/04/2016 03:47    ------------------------------------------------------------------------------------------------------------------  [facility]  you for allowing [facility] to assist in the care of your patient. Our recommendations are noted above.  Please contact us if we can be of further service.   Wells Guileseep Carmela Piechowski, MD.  [place]  Number: [phone]  Santiago Gladavid Kasa, M.D.  Billy Fischeravid Simonds, M.D  06/23/2017

## 2017-06-23 NOTE — Consult Note (Signed)
Sara Reed     Reason for Consult: Hemoptysis    Referring Physician: Claria Dice Date of Admission:  06/22/2017   Active Problems:   Acute respiratory failure with hypoxemia [facility])   HPI: Sara Reed is a 57 y.o. female admitted with 2-3 weeks of progressive cough leading to hemoptysis. She also had myalgias and fatigue but denies fevers, night sweats or weight loss.  She was noted to have hypertension, hypoxia and tachycardia. WBC was 5 and no fevers. CT showed ground glass opacities diffusely but no consolidation. Did have some calcified LN.  She had neg flu pcr and resp viral PCR. Started on levo then ceftriaxone and azithromycin.  CLinically improving, no fevers, no hypoxia and hemoptysis resolved. 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  Past Medical History:  Diagnosis Date  . Hypertension    History reviewed. No pertinent surgical history. Social History   Tobacco Use  . Smoking status: Never Smoker  . Smokeless tobacco: Never Used  Substance Use Topics  . Alcohol use: No  . Drug use: No   History reviewed. No pertinent family history.  Allergies: No Known Allergies  Current antibiotics: Antibiotics Given (last 72 hours)    Date/Time Action Medication Dose Rate   06/22/17 1052 New Bag/Given   levofloxacin (LEVAQUIN) IVPB 750 mg 750 mg 100 mL/hr   06/23/17 1021 New Bag/Given   cefTRIAXone (ROCEPHIN) 1 g in sodium chloride 0.9 % 100 mL IVPB 1 g 200 mL/hr   06/23/17 1118 New Bag/Given   azithromycin (ZITHROMAX) 500 mg in sodium chloride 0.9 % 250 mL IVPB 500 mg 250 mL/hr      MEDICATIONS: . [START ON 06/24/2017] azithromycin  500 mg Oral Daily  . blood pressure control book   Does not apply Once  . budesonide (PULMICORT) nebulizer solution  0.5 mg Nebulization BID  .  lisinopril  20 mg Oral Daily   And  . hydrochlorothiazide  25 mg Oral Daily  . ipratropium-albuterol  3 mL Nebulization TID  . methylPREDNISolone (SOLU-MEDROL) injection  60 mg Intravenous Daily    Review of Systems - 11 systems reviewed and negative per HPI   OBJECTIVE: Temp:  [98 F (36.7 C)-98.5 F (36.9 C)] 98 F (36.7 C) (02/25 1244) Pulse Rate:  [75-89] 82 (02/25 1244) Resp:  [14-20] 16 (02/25 1244) BP: (129-174)/(79-101) 132/79 (02/25 1244) SpO2:  [99 %-100 %] 99 % (02/25 1330) Physical Exam  Constitutional:  oriented to person, place, and time. appears well-developed and well-nourished. No distress.  HENT: [place]/AT, PERRLA, no scleral icterus Mouth/Throat: Oropharynx is clear and moist. No oropharyngeal exudate.  Cardiovascular: Normal rate, regular rhythm and normal heart sounds. Exam reveals no gallop and no friction rub.  No murmur heard.  Pulmonary/Chest:no wheeze, mild rhonchi Neck = supple, no nuchal rigidity Abdominal: Soft. Bowel sounds are normal.  exhibits no distension. There is no tenderness.  Lymphadenopathy: no cervical adenopathy. No axillary adenopathy Neurological: alert and oriented to person, place, and time.  Skin: Skin is warm and dry. No rash noted. No erythema.  Psychiatric: a normal mood and affect.  behavior is normal.    LABS: Results for orders placed or performed during the hospital encounter of 06/22/17 (from the past 48 hour(s))  CBC with Differential     Status: Abnormal  Collection Time: 06/22/17  7:39 AM  Result Value Ref Range   WBC 5.6 3.6 - 11.0 K/uL   RBC 4.00 3.80 - 5.20 MIL/uL   Hemoglobin 12.0 12.0 - 16.0 g/dL   HCT 35.4 35.0 - 47.0 %   MCV 88.5 80.0 - 100.0 fL   MCH 30.0 26.0 - 34.0 pg   MCHC 33.9 32.0 - 36.0 g/dL   RDW 16.0 (H) 11.5 - 14.5 %   Platelets 316 150 - 440 K/uL   Neutrophils Relative % 63 %   Neutro Abs 3.6 1.4 - 6.5 K/uL   Lymphocytes Relative 28 %   Lymphs Abs 1.6 1.0 - 3.6 K/uL   Monocytes Relative 5 %    Monocytes Absolute 0.3 0.2 - 0.9 K/uL   Eosinophils Relative 2 %   Eosinophils Absolute 0.1 0 - 0.7 K/uL   Basophils Relative 2 %   Basophils Absolute 0.1 0 - 0.1 K/uL    Comment: Performed at [facility], [address] [place], [place] [address]  Comprehensive metabolic panel     Status: Abnormal   Collection Time: 06/22/17  7:39 AM  Result Value Ref Range   Sodium 139 135 - 145 mmol/L   Potassium 3.6 3.5 - 5.1 mmol/L   Chloride 107 101 - 111 mmol/L   CO2 23 22 - 32 mmol/L   Glucose, Bld 116 (H) 65 - 99 mg/dL   BUN 11 6 - 20 mg/dL   Creatinine, Ser 0.98 0.44 - 1.00 mg/dL   Calcium 9.2 8.9 - 10.3 mg/dL   Total Protein 8.4 (H) 6.5 - 8.1 g/dL   Albumin 3.9 3.5 - 5.0 g/dL   AST 23 15 - 41 U/L   ALT 15 14 - 54 U/L   Alkaline Phosphatase 80 38 - 126 U/L   Total Bilirubin 0.6 0.3 - 1.2 mg/dL   GFR calc non Af Amer >60 >60 mL/min   GFR calc Af Amer >60 >60 mL/min    Comment: (NOTE) The eGFR has been calculated using the CKD EPI equation. This calculation has not been validated in all clinical situations. eGFR's persistently <60 mL/min signify possible Chronic Kidney Reed.    Anion gap 9 5 - 15    Comment: Performed at [facility], [address] [place], [place] [address]  Protime-INR     Status: None   Collection Time: 06/22/17  7:39 AM  Result Value Ref Range   Prothrombin Time 12.9 11.4 - 15.2 seconds   INR 0.98     Comment: Performed at [facility], [address] [place], [place] [address]  APTT     Status: None   Collection Time: 06/22/17  7:39 AM  Result Value Ref Range   aPTT 32 24 - 36 seconds    Comment: Performed at [facility], [address] [place], [place] [address]  Troponin I     Status: None   Collection Time: 06/22/17  7:39 AM  Result Value Ref Range   Troponin I <0.03 <0.03 ng/mL    Comment: Performed at [facility], [address] [place], [place] [address]  Brain natriuretic peptide      Status: None   Collection Time: 06/22/17  7:39 AM  Result Value Ref Range   B Natriuretic Peptide 81.0 0.0 - 100.0 pg/mL    Comment: Performed at [facility], [address] [place], [place] [address]  Urinalysis, Complete w Microscopic     Status: Abnormal  Collection Time: 06/22/17 11:17 AM  Result Value Ref Range   Color, Urine COLORLESS (A) YELLOW   APPearance CLEAR (A) CLEAR   Specific Gravity, Urine 1.025 1.005 - 1.030   pH 7.0 5.0 - 8.0   Glucose, UA NEGATIVE NEGATIVE mg/dL   Hgb urine dipstick NEGATIVE NEGATIVE   Bilirubin Urine NEGATIVE NEGATIVE   Ketones, ur NEGATIVE NEGATIVE mg/dL   Protein, ur NEGATIVE NEGATIVE mg/dL   Nitrite NEGATIVE NEGATIVE   Leukocytes, UA NEGATIVE NEGATIVE   RBC / HPF NONE SEEN 0 - 5 RBC/hpf   WBC, UA NONE SEEN 0 - 5 WBC/hpf   Bacteria, UA NONE SEEN NONE SEEN   Squamous Epithelial / LPF 0-5 (A) NONE SEEN   Mucus PRESENT     Comment: Performed at [facility], [address] [place], [place] [address]  Respiratory Panel by PCR     Status: None   Collection Time: 06/22/17 12:07 PM  Result Value Ref Range   Adenovirus NOT DETECTED NOT DETECTED   Coronavirus 229E NOT DETECTED NOT DETECTED   Coronavirus HKU1 NOT DETECTED NOT DETECTED   Coronavirus NL63 NOT DETECTED NOT DETECTED   Coronavirus OC43 NOT DETECTED NOT DETECTED   Metapneumovirus NOT DETECTED NOT DETECTED   Rhinovirus / Enterovirus NOT DETECTED NOT DETECTED   Influenza A NOT DETECTED NOT DETECTED   Influenza B NOT DETECTED NOT DETECTED   Parainfluenza Virus 1 NOT DETECTED NOT DETECTED   Parainfluenza Virus 2 NOT DETECTED NOT DETECTED   Parainfluenza Virus 3 NOT DETECTED NOT DETECTED   Parainfluenza Virus 4 NOT DETECTED NOT DETECTED   Respiratory Syncytial Virus NOT DETECTED NOT DETECTED   Bordetella pertussis NOT DETECTED NOT DETECTED   Chlamydophila pneumoniae NOT DETECTED NOT DETECTED   Mycoplasma pneumoniae NOT DETECTED NOT DETECTED    Comment: Performed  at [facility], [place] [address], [place], [place] [address]  Culture, expectorated sputum-assessment     Status: None   Collection Time: 06/22/17 12:07 PM  Result Value Ref Range   Specimen Description SPUTUM    Special Requests NONE    Sputum evaluation      Sputum specimen not acceptable for testing.  Please recollect.   [place] [id number] ON 06/22/17 BY SNJ Performed at [facility], [address] [place], [place] [address]    Report Status 06/22/2017 FINAL   Sedimentation rate     Status: Abnormal   Collection Time: 06/22/17 12:21 PM  Result Value Ref Range   Sed Rate 69 (H) 0 - 30 mm/hr    Comment: Performed at [facility], [address] [place], [place] [address]  C-reactive protein     Status: None   Collection Time: 06/22/17 12:21 PM  Result Value Ref Range   CRP <0.8 <1.0 mg/dL    Comment: Performed at [facility], [place] [address], [place], [place] [address]  Pathologist smear review     Status: None   Collection Time: 06/22/17 12:21 PM  Result Value Ref Range   Path Review      Smear review in notable for slight anisocytosis.  Dr. Luana Shu.    Comment: Performed at [facility], [address] [place], [place] [address]  Culture, expectorated sputum-assessment     Status: None   Collection Time: 06/22/17  4:43 PM  Result Value Ref Range   Specimen Description SPUTUM    Special Requests NONE    Sputum evaluation  THIS SPECIMEN IS ACCEPTABLE FOR SPUTUM CULTURE Performed at [facility], [address] [place], [place] [address]    Report Status 06/22/2017 FINAL   Culture, respiratory (NON-Expectorated)     Status: None (Preliminary result)   Collection Time: 06/22/17  4:43 PM  Result Value Ref Range   Specimen Description      SPUTUM Performed at [facility], [address] [place], [place] [address]    Special Requests      NONE Reflexed from [id number] Performed at [facility], [place], [place] [address]    Gram Stain      MODERATE WBC PRESENT, PREDOMINANTLY PMN MODERATE SQUAMOUS EPITHELIAL CELLS PRESENT FEW GRAM POSITIVE COCCI FEW GRAM NEGATIVE RODS FEW GRAM POSITIVE RODS FEW YEAST Performed at [facility], [place] [address] [place], [place] [address]    Culture PENDING    Report Status PENDING   CBC     Status: Abnormal   Collection Time: 06/23/17  5:40 AM  Result Value Ref Range   WBC 7.5 3.6 - 11.0 K/uL   RBC 3.98 3.80 - 5.20 MIL/uL   Hemoglobin 11.6 (L) 12.0 - 16.0 g/dL   HCT 34.9 (L) 35.0 - 47.0 %   MCV 87.6 80.0 - 100.0 fL   MCH 29.1 26.0 - 34.0 pg   MCHC 33.2 32.0 - 36.0 g/dL   RDW 16.0 (H) 11.5 - 14.5 %   Platelets 341 150 - 440 K/uL    Comment: Performed at [facility], [address] [place], [place] [address]   No components found for: ESR, C REACTIVE PROTEIN MICRO: Recent Results (from the past 720 hour(s))  Respiratory Panel by PCR     Status: None   Collection Time: 06/22/17 12:07 PM  Result Value Ref Range Status   Adenovirus NOT DETECTED NOT DETECTED Final   Coronavirus 229E NOT DETECTED NOT DETECTED Final   Coronavirus HKU1 NOT DETECTED NOT DETECTED Final   Coronavirus NL63 NOT DETECTED NOT DETECTED Final   Coronavirus OC43 NOT DETECTED NOT DETECTED Final   Metapneumovirus NOT DETECTED NOT DETECTED Final   Rhinovirus / Enterovirus NOT DETECTED NOT DETECTED Final   Influenza A NOT DETECTED NOT DETECTED Final   Influenza B NOT DETECTED NOT DETECTED Final   Parainfluenza Virus 1 NOT DETECTED NOT DETECTED Final   Parainfluenza Virus 2 NOT DETECTED NOT DETECTED Final   Parainfluenza Virus 3 NOT DETECTED NOT DETECTED Final   Parainfluenza Virus 4 NOT DETECTED NOT DETECTED Final   Respiratory Syncytial Virus NOT DETECTED NOT DETECTED Final   Bordetella pertussis NOT DETECTED NOT DETECTED Final   Chlamydophila pneumoniae NOT DETECTED NOT DETECTED Final   Mycoplasma pneumoniae NOT DETECTED NOT  DETECTED Final    Comment: Performed at [facility], [place] [address] [place], [place] [address]  Culture, expectorated sputum-assessment     Status: None   Collection Time: 06/22/17 12:07 PM  Result Value Ref Range Status   Specimen Description SPUTUM  Final   Special Requests NONE  Final   Sputum evaluation   Final    Sputum specimen not acceptable for testing.  Please recollect.   [place] 4010 ON 06/22/17 BY SNJ Performed at [facility], [address] [place], [place] [address]    Report Status 06/22/2017 FINAL  Final  Culture, expectorated sputum-assessment     Status: None   Collection Time: 06/22/17  4:43 PM  Result Value Ref Range  Status   Specimen Description SPUTUM  Final   Special Requests NONE  Final   Sputum evaluation   Final    THIS SPECIMEN IS ACCEPTABLE FOR SPUTUM CULTURE Performed at [facility], [address] [place], [place] [address]    Report Status 06/22/2017 FINAL  Final  Culture, respiratory (NON-Expectorated)     Status: None (Preliminary result)   Collection Time: 06/22/17  4:43 PM  Result Value Ref Range Status   Specimen Description   Final    SPUTUM Performed at [facility], [address], [place], [place] [address]    Special Requests   Final    NONE Reflexed from [id number] Performed at [facility], [address] [place], [place] [address]    Gram Stain   Final    MODERATE WBC PRESENT, PREDOMINANTLY PMN MODERATE SQUAMOUS EPITHELIAL CELLS PRESENT FEW GRAM POSITIVE COCCI FEW GRAM NEGATIVE RODS FEW GRAM POSITIVE RODS FEW YEAST Performed at [facility], [place] [address], [place], [place] [address]    Culture PENDING  Incomplete   Report Status PENDING  Incomplete    IMAGING: Dg Chest 1 View  Result Date: 06/22/2017 CLINICAL DATA:  Cough and shortness of breath EXAM: CHEST 1 VIEW COMPARISON:  Sep 04, 2016 FINDINGS: Mild cardiomegaly. The hila and mediastinum are  unchanged. Mild interstitial prominence suggests pulmonary venous congestion. No focal infiltrate or overt edema. IMPRESSION: Possible mild pulmonary venous congestion. No overt edema or focal infiltrate. Electronically Signed   By: Dorise Bullion III M.D   On: 06/22/2017 07:54   Ct Angio Chest Pe W And/or Wo Contrast  Result Date: 06/22/2017 CLINICAL DATA:  Hemoptysis.  Hypoxia. EXAM: CT ANGIOGRAPHY CHEST WITH CONTRAST TECHNIQUE: Multidetector CT imaging of the chest was performed using the standard protocol during bolus administration of intravenous contrast. Multiplanar CT image reconstructions and MIPs were obtained to evaluate the vascular anatomy. CONTRAST:  45m ISOVUE-370 IOPAMIDOL (ISOVUE-370) INJECTION 76% COMPARISON:  Chest radiograph from earlier today. FINDINGS: Cardiovascular: The study is high quality for the evaluation of pulmonary embolism. There are no filling defects in the central, lobar, segmental or subsegmental pulmonary artery branches to suggest acute pulmonary embolism. Great vessels are normal in course and caliber. Borderline mild cardiomegaly. No significant pericardial fluid/thickening. Mediastinum/Nodes: No discrete thyroid nodules. Unremarkable esophagus. No pathologically enlarged axillary, mediastinal or hilar lymph nodes. Coarsely calcified nonenlarged subcarinal and right hilar nodes from prior granulomatous Reed. Lungs/Pleura: No pneumothorax. No pleural effusion. No acute consolidative airspace Reed, lung masses or significant pulmonary nodules. Extensive patchy ground-glass opacities are noted throughout the bilateral dependent lungs, involving all lung lobes, most prominent in the lower lobes. Upper abdomen: Small hiatal hernia. Musculoskeletal: No aggressive appearing focal osseous lesions. Mild thoracic spondylosis. Review of the MIP images confirms the above findings. IMPRESSION: 1. No pulmonary embolism. 2. Extensive patchy ground-glass opacities throughout the  dependent lungs bilaterally involving all lung lobes, most prominent in the lower lobes. Differential is broad and includes diffuse alveolar hemorrhage such as from vasculitis, aspiration pneumonitis or pulmonary edema given the borderline mild cardiomegaly. 3. Small hiatal hernia. Electronically Signed   By: JIlona SorrelM.D.   On: 06/22/2017 09:42    Assessment:   ASTEPFANIE YOTTis a 57y.o. female with a a 2-3 week course of cough, myalgias and then with hemoptysis. 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  She has not had any fevers, chills, NS or weight loss.  Of note in May 2018 had similar findings on CXR with hazy bibasilar opacities when presented with CAP sxs in ED.   This admission no fevers, or leukocytosis. HIV test pending.  Much improved with 2 days abx.  Resp PCR and flu PCR negative.  I suspect she has some underlying pulmonary process and has a viral or atypical bronchitis causing her acute illness.  I do not think she has active TB but certainaly should be discussed with the HD and have follow up to test sputums and treat for LTBI if cultures negative.  Recommendations Check urine legionella and strep PNA ag. Check urine tox screen. Check mycoplasma serology. Sputum cx is pending.  I do not think this is TB so could dc home once stable with follow up at the [facility] - can call TB RN at [phone] to notify her regarding need for follow up. If improving can dc on oral levofloxacin for atypical coverage for total abx course of 7 days.  Thank you very much for allowing me to participate in the care of this patient. Please call with questions.   Cheral Marker. Ola Spurr, MD

## 2017-06-23 NOTE — Progress Notes (Signed)
*  PRELIMINARY RESULTS* Echocardiogram 2D Echocardiogram has been performed.  Cristela BlueHege, Maly Lemarr 06/23/2017, 3:11 PM

## 2017-06-24 LAB — ECHOCARDIOGRAM COMPLETE
Height: 65 in
Weight: 3200 oz

## 2017-06-24 LAB — COMPLEMENT, TOTAL: Compl, Total (CH50): >60 U/mL (ref >41)

## 2017-06-24 LAB — ACID FAST SMEAR (AFB, MYCOBACTERIA)

## 2017-06-24 LAB — ANCA TITERS
C-ANCA: <1:20 {titer}
P-ANCA: <1:20 {titer}

## 2017-06-24 LAB — BASIC METABOLIC PANEL
Anion gap: 10 (ref 5–15)
BUN: 30 mg/dL — AB (ref 6–20)
CALCIUM: 9.3 mg/dL (ref 8.9–10.3)
CHLORIDE: 105 mmol/L (ref 101–111)
CO2: 21 mmol/L — AB (ref 22–32)
CREATININE: 1.27 mg/dL — AB (ref 0.44–1.00)
GFR calc non Af Amer: 47 mL/min — ABNORMAL LOW (ref >60)
GFR, EST AFRICAN AMERICAN: 54 mL/min — AB (ref >60)
GLUCOSE: 124 mg/dL — AB (ref 65–99)
Potassium: 3.9 mmol/L (ref 3.5–5.1)
Sodium: 136 mmol/L (ref 135–145)

## 2017-06-24 LAB — ANA COMPREHENSIVE PANEL
Chromatin Ab SerPl-aCnc: <0.2 AI (ref 0.0–0.9)
ENA SM Ab Ser-aCnc: <0.2 AI (ref 0.0–0.9)
Ribonucleic Protein: 0.2 AI (ref 0.0–0.9)
Scleroderma (Scl-70) (ENA) Antibody, IgG: 0.2 AI (ref 0.0–0.9)
ds DNA Ab: <1 IU/mL (ref 0–9)

## 2017-06-24 LAB — ACID FAST SMEAR (AFB): ACID FAST SMEAR - AFSCU2: NEGATIVE

## 2017-06-24 LAB — HIV ANTIBODY (ROUTINE TESTING W REFLEX): HIV Screen 4th Generation wRfx: NONREACTIVE

## 2017-06-24 LAB — STREP PNEUMONIAE URINARY ANTIGEN: Strep Pneumo Urinary Antigen: NEGATIVE

## 2017-06-24 MED ORDER — LEVOFLOXACIN 750 MG PO TABS: 750.0000 mg | ORAL_TABLET | Freq: Every day | ORAL | 0 refills | Status: DC

## 2017-06-24 MED ORDER — LEVOFLOXACIN 750 MG PO TABS: 750.0000 mg | ORAL_TABLET | Freq: Every day | ORAL | 0 refills | Status: AC

## 2017-06-24 MED ORDER — AMLODIPINE BESYLATE 5 MG PO TABS
5.0000 mg | ORAL_TABLET | Freq: Every day | ORAL | Status: DC
  Administered 2017-06-24: 5 mg via ORAL
  Filled 2017-06-24: qty 1

## 2017-06-24 MED ORDER — AMLODIPINE BESYLATE 5 MG PO TABS: 5.0000 mg | ORAL_TABLET | Freq: Every day | ORAL | 1 refills | Status: DC

## 2017-06-24 NOTE — Care Management (Signed)
Patient discharged home today.  Patient provided with coupons from goodrx.com for medication.  Patient denies issues obtaining medications.  [facility] at [facility].  Provided with "The Network:  Your Guide to [employer] [facility] [place]"  Booklet

## 2017-06-24 NOTE — Progress Notes (Signed)
Sara BrownsAngela Y Reed  A and O x 4. VSS. Pt tolerating diet well. No complaints of pain or nausea. IV removed intact, prescriptions given. Pt voiced understanding of discharge instructions with no further questions. Pt discharged via wheelchair with nurse tech. Given pt information to follow up with health care department once she gets home.    Allergies as of 06/24/2017   No Known Allergies     Medication List    TAKE these medications   amLODipine 5 MG tablet Commonly known as:  NORVASC Take 1 tablet (5 mg total) by mouth daily.   aspirin EC 81 MG tablet Take 81 mg by mouth daily.   levofloxacin 750 MG tablet Commonly known as:  LEVAQUIN Take 1 tablet (750 mg total) by mouth daily for 7 days. Start taking on:  06/25/2017   lisinopril-hydrochlorothiazide 20-25 MG tablet Commonly known as:  PRINZIDE,ZESTORETIC Take 1 tablet by mouth daily.       Vitals:   06/24/17 0824 06/24/17 0918  BP:  (!) 153/81  Pulse:  98  Resp:  18  Temp:  98.1 F (36.7 C)  SpO2: 100% 100%    Suzzanne CloudJuan G Rodriguez Ornelas

## 2017-06-24 NOTE — Progress Notes (Signed)
Hydralazine given X 2 PRN for hypertension. Sputum obtained, waiting for lab to confirm that it is adequate for tests. Windy Carinaurner,Allexis Bordenave K, RN 6:41 AM 06/24/2017

## 2017-06-24 NOTE — Discharge Instructions (Signed)

## 2017-06-24 NOTE — Progress Notes (Signed)
     Sara Reed was admitted to the [facility] on 06/22/2017 for an acute medical condition and is being Discharged on  06/24/2017 .  She will need another 3-4 days for recovery and so advised to stay away from work until then. So please excuse her from work for the above Days. Should be able to return to work without any restrictions from 06/30/17.  Call Enid Baasadhika Mohamad Bruso  MD, [facility] at  [phone] with questions.  Enid BaasKALISETTI,Talley Kreiser M.D on 06/24/2017,at 11:38 AM  [facility] [address], [place] [id number]

## 2017-06-25 LAB — CULTURE, RESPIRATORY W GRAM STAIN

## 2017-06-25 LAB — CULTURE, RESPIRATORY: CULTURE: NORMAL

## 2017-06-25 LAB — ACID FAST SMEAR (AFB): ACID FAST SMEAR - AFSCU2: NEGATIVE

## 2017-06-25 LAB — LEGIONELLA PNEUMOPHILA SEROGP 1 UR AG: L. pneumophila Serogp 1 Ur Ag: NEGATIVE

## 2017-06-25 LAB — MYCOPLASMA PNEUMONIAE ANTIBODY, IGM: Mycoplasma pneumo IgM: <770 U/mL (ref 0–769)

## 2017-06-25 NOTE — Discharge Summary (Signed)
[facility] [place] [facility]   PATIENT NAME: Sara Reed    MR#:  [id number]  DATE OF BIRTH:  09-Dec-1960  DATE OF ADMISSION:  06/22/2017   ADMITTING PHYSICIAN: Bertrum Sol, MD  DATE OF DISCHARGE: 06/24/2017 12:54 PM  PRIMARY CARE PHYSICIAN: Hillery Aldo, MD   ADMISSION DIAGNOSIS:   Hypoxia [R09.02] Hemoptysis [R04.2] Ground glass opacity present on imaging of lung [R91.8]  DISCHARGE DIAGNOSIS:   Active Problems:   Acute respiratory failure with hypoxemia (HCC)   SECONDARY DIAGNOSIS:   Past Medical History:  Diagnosis Date  . Hypertension     HOSPITAL COURSE:   57 year old female with past medical history significant for hypertension presents to hospital secondary to worsening cough, hemoptysis.  1. Acute pneumonitis- likely  viral pneumonitis.  - ANA work up negative for any vasculitis -Had minimal hemoptysis, none since admission. - Patient does have a very remote history of exposure to tuberculosis. Hasn't been symptomatic, denies any weight loss or night sweats. -Airborne isolation has been placed, AFBs x 1 is negative. Advised to follow up with health department. Second sample sent and is pending at this time. -Echo with normal ejection fraction -Patient improved remarkably well on antibiotics. Off oxygen. Will be discharged on Levaquin -Pulmonary consult and ID consult appreciated  2. Hypertensive urgency-restarted home blood pressure medications after verification. -Started Norvasc at discharge  3. Hypokalemia-replaced  Doing well and discharge today    DISCHARGE CONDITIONS:   Guarded  CONSULTS OBTAINED:   Treatment Team:  Mick Sell, MD  DRUG ALLERGIES:   No Known Allergies DISCHARGE MEDICATIONS:   Allergies as of 06/24/2017   No Known Allergies     Medication List    TAKE these medications   amLODipine 5 MG tablet Commonly known as:  NORVASC Take 1 tablet (5 mg total) by mouth daily.     aspirin EC 81 MG tablet Take 81 mg by mouth daily.   levofloxacin 750 MG tablet Commonly known as:  LEVAQUIN Take 1 tablet (750 mg total) by mouth daily for 7 days.   lisinopril-hydrochlorothiazide 20-25 MG tablet Commonly known as:  PRINZIDE,ZESTORETIC Take 1 tablet by mouth daily.        DISCHARGE INSTRUCTIONS:   1. PCP f/u in 1-2 weeks  DIET:   Cardiac diet  ACTIVITY:   Activity as tolerated  OXYGEN:   Home Oxygen: No.  Oxygen Delivery: room air  DISCHARGE LOCATION:   home   If you experience worsening of your admission symptoms, develop shortness of breath, life threatening emergency, suicidal or homicidal thoughts you must seek medical attention immediately by calling 911 or calling your MD immediately  if symptoms less severe.  You Must read complete instructions/literature along with all the possible adverse reactions/side effects for all the Medicines you take and that have been prescribed to you. Take any new Medicines after you have completely understood and accpet all the possible adverse reactions/side effects.   Please note  You were cared for by a hospitalist during your hospital stay. If you have any questions about your discharge medications or the care you received while you were in the hospital after you are discharged, you can call the unit and asked to speak with the hospitalist on call if the hospitalist that took care of you is not available. Once you are discharged, your primary care physician will handle any further medical issues. Please note that NO REFILLS for any discharge medications will be authorized once you  are discharged, as it is imperative that you return to your primary care physician (or establish a relationship with a primary care physician if you do not have one) for your aftercare needs so that they can reassess your need for medications and monitor your lab values.    On the day of Discharge:  VITAL SIGNS:   Blood pressure  (!) 153/81, pulse 98, temperature 98.1 F (36.7 C), temperature source Oral, resp. rate 18, height 5\' 5"  (1.651 m), weight 90.7 kg (200 lb), last menstrual period 03/20/2017, SpO2 100 %.  PHYSICAL EXAMINATION:    GENERAL:  57 y.o.-year-old patient lying in the bed with no acute distress.  EYES: Pupils equal, round, reactive to light and accommodation. No scleral icterus. Extraocular muscles intact.  HEENT: Head atraumatic, normocephalic. Oropharynx and nasopharynx clear.  NECK:  Supple, no jugular venous distention. No thyroid enlargement, no tenderness.  LUNGS: Normal breath sounds bilaterally, no wheezing, rales or crepitation. very few scattered rhonchi at the bases. No use of accessory muscles of respiration.  CARDIOVASCULAR: S1, S2 normal. No murmurs, rubs, or gallops.  ABDOMEN: Soft, nontender, nondistended. Bowel sounds present. No organomegaly or mass.  EXTREMITIES: No pedal edema, cyanosis, or clubbing.  NEUROLOGIC: Cranial nerves II through XII are intact. Muscle strength 5/5 in all extremities. Sensation intact. Gait not checked.  PSYCHIATRIC: The patient is alert and oriented x 3.  SKIN: No obvious rash, lesion, or ulcer.     DATA REVIEW:   CBC Recent Labs  Lab 06/23/17 0540  WBC 7.5  HGB 11.6*  HCT 34.9*  PLT 341    Chemistries  Recent Labs  Lab 06/22/17 0739 06/24/17 0459  NA 139 136  K 3.6 3.9  CL 107 105  CO2 23 21*  GLUCOSE 116* 124*  BUN 11 30*  CREATININE 0.98 1.27*  CALCIUM 9.2 9.3  AST 23  --   ALT 15  --   ALKPHOS 80  --   BILITOT 0.6  --      Microbiology Results  Results for orders placed or performed during the hospital encounter of 06/22/17  Respiratory Panel by PCR     Status: None   Collection Time: 06/22/17 12:07 PM  Result Value Ref Range Status   Adenovirus NOT DETECTED NOT DETECTED Final   Coronavirus 229E NOT DETECTED NOT DETECTED Final   Coronavirus HKU1 NOT DETECTED NOT DETECTED Final   Coronavirus NL63 NOT DETECTED NOT  DETECTED Final   Coronavirus OC43 NOT DETECTED NOT DETECTED Final   Metapneumovirus NOT DETECTED NOT DETECTED Final   Rhinovirus / Enterovirus NOT DETECTED NOT DETECTED Final   Influenza A NOT DETECTED NOT DETECTED Final   Influenza B NOT DETECTED NOT DETECTED Final   Parainfluenza Virus 1 NOT DETECTED NOT DETECTED Final   Parainfluenza Virus 2 NOT DETECTED NOT DETECTED Final   Parainfluenza Virus 3 NOT DETECTED NOT DETECTED Final   Parainfluenza Virus 4 NOT DETECTED NOT DETECTED Final   Respiratory Syncytial Virus NOT DETECTED NOT DETECTED Final   Bordetella pertussis NOT DETECTED NOT DETECTED Final   Chlamydophila pneumoniae NOT DETECTED NOT DETECTED Final   Mycoplasma pneumoniae NOT DETECTED NOT DETECTED Final    Comment: Performed at [facility], [address] [place], [place] [address]  Culture, expectorated sputum-assessment     Status: None   Collection Time: 06/22/17 12:07 PM  Result Value Ref Range Status   Specimen Description SPUTUM  Final   Special Requests NONE  Final   Sputum evaluation  Final    Sputum specimen not acceptable for testing.  Please recollect.   CALLED ERICA JOHNSON AT 1318 ON 06/22/17 BY SNJ Performed at [facility], [address] [place], [place] [address]    Report Status 06/22/2017 FINAL  Final  Culture, expectorated sputum-assessment     Status: None   Collection Time: 06/22/17  4:43 PM  Result Value Ref Range Status   Specimen Description SPUTUM  Final   Special Requests NONE  Final   Sputum evaluation   Final    THIS SPECIMEN IS ACCEPTABLE FOR SPUTUM CULTURE Performed at [facility], [address] [place], [place] [address]    Report Status 06/22/2017 FINAL  Final  Culture, respiratory (NON-Expectorated)     Status: None   Collection Time: 06/22/17  4:43 PM  Result Value Ref Range Status   Specimen Description   Final    SPUTUM Performed at [facility], [address] [place], [place]  [address]    Special Requests   Final    NONE Reflexed from [id number] Performed at [facility], [address] [place], [place] [address]    Gram Stain   Final    MODERATE WBC PRESENT, PREDOMINANTLY PMN MODERATE SQUAMOUS EPITHELIAL CELLS PRESENT FEW GRAM POSITIVE COCCI FEW GRAM NEGATIVE RODS FEW GRAM POSITIVE RODS FEW YEAST    Culture   Final    MODERATE Consistent with normal respiratory flora. Performed at [facility], [address] [place], [place] [address]    Report Status 06/25/2017 FINAL  Final  Acid Fast Smear (AFB)     Status: None   Collection Time: 06/23/17  1:32 PM  Result Value Ref Range Status   AFB Specimen Processing Concentration  Final   Acid Fast Smear Negative  Final    Comment: (NOTE) Performed At: [facility] [address] [place], [place] [address] Jolene SchimkeNagendra Sanjai MD XL:[id number]:[phone]    Source (AFB) FLUID  Final    Comment: Performed at [facility], [address] [place], [place] [address]    RADIOLOGY:  No results found.   Management plans discussed with the patient, family and they are in agreement.  CODE STATUS:  Code Status History    Date Active Date Inactive Code Status Order ID Comments User Context   06/22/2017 12:04 06/24/2017 15:59 Full Code [id number]  Salary, Evelena AsaMontell D, MD Inpatient      TOTAL TIME TAKING CARE OF THIS PATIENT: 38 minutes.    Enid BaasKALISETTI,Viviene Thurston M.D on 06/25/2017 at 4:09 PM  Between 7am to 6pm - Pager - [phone]  After 6pm go to [email or web address] - Social research officer, governmentpassword [email or web address]  [facility]  Office  [phone]  CC: Primary care physician; Hillery AldoPatel, Sarah, MD   Note: This dictation was prepared with Dragon dictation along with smaller phrase technology. Any transcriptional errors that result from this process are unintentional.

## 2017-06-27 LAB — CRYOGLOBULIN

## 2017-07-14 ENCOUNTER — Encounter: Payer: Self-pay | Admitting: Internal Medicine

## 2017-07-21 LAB — QUANTIFERON-TB GOLD PLUS

## 2017-08-05 ENCOUNTER — Telehealth: Payer: Self-pay | Admitting: Internal Medicine

## 2017-08-05 NOTE — Telephone Encounter (Signed)
Just FYI for physician  Order placed for patient to have a CT Chest in April. I have left multiple messages on pt's phone to contact office to schedule. After not receiving a call back from patient a letter was mailed to patient on 07/14/17.  Pt has still not contacted this office to schedule CT Chest.     Order for CT Chest has been closed and if patient calls to schedule, order can be reopened at that time to schedule. Rhonda J Cobb

## 2017-08-06 ENCOUNTER — Inpatient Hospital Stay: Payer: Medicaid Other | Admitting: Internal Medicine

## 2017-08-06 LAB — ACID FAST CULTURE WITH REFLEXED SENSITIVITIES (MYCOBACTERIA)

## 2017-08-06 LAB — ACID FAST CULTURE WITH REFLEXED SENSITIVITIES: ACID FAST CULTURE - AFSCU3: NEGATIVE

## 2017-08-07 ENCOUNTER — Encounter: Payer: Self-pay | Admitting: Internal Medicine

## 2017-08-07 LAB — ACID FAST CULTURE WITH REFLEXED SENSITIVITIES (MYCOBACTERIA): Acid Fast Culture: NEGATIVE

## 2017-09-03 ENCOUNTER — Ambulatory Visit
Admission: RE | Admit: 2017-09-03 | Discharge: 2017-09-03 | Disposition: A | Discharge status: Home / Self Care | Payer: Medicaid Other | Source: Ambulatory Visit | Attending: Oncology | Admitting: Oncology

## 2017-09-03 ENCOUNTER — Ambulatory Visit: Payer: Self-pay | Attending: Oncology

## 2017-09-03 VITALS — BP 157/94 | HR 82 | Temp 97.8°F | Resp 18 | Ht 65.0 in | Wt 218.7 lb

## 2017-09-03 DIAGNOSIS — Z0000 Encounter for general adult medical examination without abnormal findings: Secondary | ICD-10-CM

## 2017-09-03 NOTE — Progress Notes (Signed)
  Subjective:     Patient ID: Sara Reed, female   DOB: [birth year], 57 y.o.   MRN: [id number]  HPI   Review of Systems     Objective:   Physical Exam  Pulmonary/Chest: Right breast exhibits no inverted nipple, no mass, no nipple discharge, no skin change and no tenderness. Left breast exhibits no inverted nipple, no mass, no nipple discharge, no skin change and no tenderness. Breasts are symmetrical.  Genitourinary: No labial fusion. There is no rash, tenderness, lesion or injury on the right labia. There is no rash, tenderness, lesion or injury on the left labia. Uterus is deviated. Uterus is not enlarged, not fixed and not tender. Cervix exhibits no motion tenderness, no discharge and no friability. Right adnexum displays no mass, no tenderness and no fullness. Left adnexum displays no mass, no tenderness and no fullness. No erythema, tenderness or bleeding in the vagina. No foreign body in the vagina. No signs of injury around the vagina. Vaginal discharge found.  Genitourinary Comments: Thin white non-odorous discharge;  Uterus anteverted       Assessment:     57 year old patient presents for BCCCP clinic visitPatient screened, and meets BCCCP eligibility.  Patient does not have insurance, Medicare or Medicaid.  Handout given on Affordable Care Act.Instructed patient on breast self awareness using teach back methodClinical breast exam unremarkable.  No mass or lump palpated.  Pelvic exam normal.  Last menstrual cycle middle of April per patient.  States cycles are regular.  Given Understanding Pap booklet.    Plan:     Sent for bilateral screening mammogram.  Specimen collected for pap.

## 2017-09-03 NOTE — Progress Notes (Signed)
Sent for bilateral screening mammogram.  Specimen collected for pap.  Phoned patient with [facility] and negative/negative pap results.  Next pap due in 5 years.   Pap results positive for candida.  Patient to try Monistat over the counter if symptomatic.  Patient to return in one year for annual screening.  Copy to [facility].

## 2017-09-09 LAB — PAP LB AND HPV HIGH-RISK
HPV, HIGH-RISK: NEGATIVE
PAP Smear Comment: 0

## 2018-07-10 ENCOUNTER — Emergency Department: Payer: No Typology Code available for payment source

## 2018-07-10 ENCOUNTER — Encounter: Payer: Self-pay | Admitting: Emergency Medicine

## 2018-07-10 ENCOUNTER — Other Ambulatory Visit: Payer: Self-pay

## 2018-07-10 ENCOUNTER — Emergency Department
Admission: EM | Admit: 2018-07-10 | Discharge: 2018-07-10 | Disposition: A | Discharge status: Home / Self Care | Payer: No Typology Code available for payment source | Attending: Emergency Medicine | Admitting: Emergency Medicine

## 2018-07-10 DIAGNOSIS — Y9389 Activity, other specified: Secondary | ICD-10-CM | POA: Insufficient documentation

## 2018-07-10 DIAGNOSIS — Y998 Other external cause status: Secondary | ICD-10-CM | POA: Diagnosis not present

## 2018-07-10 DIAGNOSIS — I10 Essential (primary) hypertension: Secondary | ICD-10-CM | POA: Insufficient documentation

## 2018-07-10 DIAGNOSIS — M542 Cervicalgia: Secondary | ICD-10-CM | POA: Diagnosis not present

## 2018-07-10 DIAGNOSIS — M545 Low back pain: Secondary | ICD-10-CM | POA: Diagnosis not present

## 2018-07-10 DIAGNOSIS — M25532 Pain in left wrist: Secondary | ICD-10-CM | POA: Insufficient documentation

## 2018-07-10 DIAGNOSIS — M25562 Pain in left knee: Secondary | ICD-10-CM | POA: Insufficient documentation

## 2018-07-10 DIAGNOSIS — Y92410 Unspecified street and highway as the place of occurrence of the external cause: Secondary | ICD-10-CM | POA: Insufficient documentation

## 2018-07-10 DIAGNOSIS — M549 Dorsalgia, unspecified: Secondary | ICD-10-CM | POA: Insufficient documentation

## 2018-07-10 DIAGNOSIS — M25561 Pain in right knee: Secondary | ICD-10-CM | POA: Insufficient documentation

## 2018-07-10 MED ORDER — MELOXICAM 15 MG PO TABS: 15.0000 mg | ORAL_TABLET | Freq: Every day | ORAL | 1 refills | Status: AC

## 2018-07-10 MED ORDER — METHOCARBAMOL 500 MG PO TABS: 500.0000 mg | ORAL_TABLET | Freq: Three times a day (TID) | ORAL | 0 refills | Status: AC | PRN
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
## 2019-05-25 ENCOUNTER — Other Ambulatory Visit: Payer: Self-pay

## 2019-05-25 ENCOUNTER — Emergency Department
Admission: EM | Admit: 2019-05-25 | Discharge: 2019-05-25 | Disposition: A | Discharge status: Home / Self Care | Payer: Medicaid Other | Attending: Emergency Medicine | Admitting: Emergency Medicine

## 2019-05-25 ENCOUNTER — Emergency Department: Payer: Medicaid Other

## 2019-05-25 ENCOUNTER — Encounter: Payer: Self-pay | Admitting: Emergency Medicine

## 2019-05-25 DIAGNOSIS — X501XXA Overexertion from prolonged static or awkward postures, initial encounter: Secondary | ICD-10-CM | POA: Insufficient documentation

## 2019-05-25 DIAGNOSIS — Z20822 Contact with and (suspected) exposure to covid-19: Secondary | ICD-10-CM | POA: Insufficient documentation

## 2019-05-25 DIAGNOSIS — Z79899 Other long term (current) drug therapy: Secondary | ICD-10-CM | POA: Insufficient documentation

## 2019-05-25 DIAGNOSIS — Z7982 Long term (current) use of aspirin: Secondary | ICD-10-CM | POA: Insufficient documentation

## 2019-05-25 DIAGNOSIS — Y92014 Private driveway to single-family (private) house as the place of occurrence of the external cause: Secondary | ICD-10-CM | POA: Insufficient documentation

## 2019-05-25 DIAGNOSIS — S82831A Other fracture of upper and lower end of right fibula, initial encounter for closed fracture: Secondary | ICD-10-CM

## 2019-05-25 DIAGNOSIS — Y999 Unspecified external cause status: Secondary | ICD-10-CM | POA: Insufficient documentation

## 2019-05-25 DIAGNOSIS — I10 Essential (primary) hypertension: Secondary | ICD-10-CM | POA: Insufficient documentation

## 2019-05-25 DIAGNOSIS — Y9301 Activity, walking, marching and hiking: Secondary | ICD-10-CM | POA: Insufficient documentation

## 2019-05-25 LAB — SARS CORONAVIRUS 2 (TAT 6-24 HRS): SARS Coronavirus 2: NEGATIVE

## 2019-05-25 MED ORDER — OXYCODONE-ACETAMINOPHEN 5-325 MG PO TABS
1.0000 | ORAL_TABLET | Freq: Once | ORAL | Status: AC
  Administered 2019-05-25: 1 via ORAL
  Filled 2019-05-25: qty 1

## 2019-05-25 MED ORDER — OXYCODONE-ACETAMINOPHEN 5-325 MG PO TABS: 1.0000 | ORAL_TABLET | Freq: Four times a day (QID) | ORAL | 0 refills | Status: AC | PRN

## 2019-05-25 NOTE — ED Notes (Signed)
See triage note  States she lost her foot and slipped   Twisting right ankle behind her    Positive swelling noted  Good pulses  Unable to bear full wt
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
## 2019-05-25 NOTE — ED Triage Notes (Signed)
Pt states she slipped and fell going down her steps outside this am, pain to right ankle. NAD.

## 2019-05-25 NOTE — Discharge Instructions (Signed)
Dr. Ernest Pine plans to take you to surgery tomorrow or Thursday.  Please call his office this morning to schedule surgery.  You can take oxycodone for pain.  Ice and elevate ankle today.  Use crutches.

## 2019-05-26 ENCOUNTER — Encounter: Payer: Self-pay | Admitting: *Deleted

## 2019-05-26 ENCOUNTER — Emergency Department
Admission: EM | Admit: 2019-05-26 | Discharge: 2019-05-26 | Disposition: A | Discharge status: Home / Self Care | Payer: Self-pay | Attending: Emergency Medicine | Admitting: Emergency Medicine

## 2019-05-26 DIAGNOSIS — S82891D Other fracture of right lower leg, subsequent encounter for closed fracture with routine healing: Secondary | ICD-10-CM | POA: Insufficient documentation

## 2019-05-26 DIAGNOSIS — Z7982 Long term (current) use of aspirin: Secondary | ICD-10-CM | POA: Insufficient documentation

## 2019-05-26 DIAGNOSIS — Z4789 Encounter for other orthopedic aftercare: Secondary | ICD-10-CM

## 2019-05-26 DIAGNOSIS — Z79899 Other long term (current) drug therapy: Secondary | ICD-10-CM | POA: Insufficient documentation

## 2019-05-26 DIAGNOSIS — I10 Essential (primary) hypertension: Secondary | ICD-10-CM | POA: Insufficient documentation

## 2019-05-26 DIAGNOSIS — W0110XD Fall on same level from slipping, tripping and stumbling with subsequent striking against unspecified object, subsequent encounter: Secondary | ICD-10-CM | POA: Insufficient documentation

## 2019-05-26 NOTE — ED Provider Notes (Signed)
[facility] Emergency Department Provider Note ____________________________________________  Time seen: Approximately 6:06 PM  I have reviewed the triage vital signs and the nursing notes.   HISTORY  Chief Complaint Foot Pain    HPI Sara Reed is a 60 y.o. female who presents to the emergency complaining of cast on her right ankle feeling too tight. Splint was applied yesterday after slipping in the mud and fracturing her ankle. Overnight, she states that her toes began to feel "tight." She denies increase in ankle pain or new injury.   Past Medical History:  Diagnosis Date  . Hypertension     Patient Active Problem List   Diagnosis Date Noted  . Acute respiratory failure with hypoxemia (HCC) 06/22/2017    History reviewed. No pertinent surgical history.  Prior to Admission medications   Medication Sig Start Date End Date Taking? Authorizing Provider  amLODipine (NORVASC) 5 MG tablet Take 1 tablet (5 mg total) by mouth daily. 06/24/17 06/24/18  Enid Baas, MD  aspirin EC 81 MG tablet Take 81 mg by mouth daily.    [provider]  lisinopril-hydrochlorothiazide (PRINZIDE,ZESTORETIC) 20-25 MG tablet Take 1 tablet by mouth daily.    [provider]  oxyCODONE-acetaminophen (PERCOCET) 5-325 MG tablet Take 1 tablet by mouth every 6 (six) hours as needed for up to 3 days for severe pain. 05/25/19 05/28/19  Enid Derry, PA-C    Allergies Patient has no known allergies.  Family History  Problem Relation Age of Onset  . Breast cancer Neg Hx     Social History Social History   Tobacco Use  . Smoking status: Never Smoker  . Smokeless tobacco: Never Used  Substance Use Topics  . Alcohol use: No  . Drug use: No    Review of Systems Constitutional: Negative for fever. Cardiovascular: Negative for chest pain. Respiratory: Negative for shortness of breath. Musculoskeletal: Positive for right lower extremity pain Skin:  Negative for open wounds.  Neurological: Negative for decrease in sensation  ____________________________________________   PHYSICAL EXAM:  VITAL SIGNS: ED Triage Vitals  Enc Vitals Group     BP 05/26/19 1802 (!) 209/120     Pulse Rate 05/26/19 1802 93     Resp 05/26/19 1802 16     Temp 05/26/19 1802 98 F (36.7 C)     Temp Source 05/26/19 1802 Oral     SpO2 05/26/19 1802 100 %     Weight 05/26/19 1750 210 lb (95.3 kg)     Height --      Head Circumference --      Peak Flow --      Pain Score 05/26/19 1750 7     Pain Loc --      Pain Edu? --      Excl. in GC? --     Constitutional: Alert and oriented. Well appearing and in no acute distress. Eyes: Conjunctivae are clear without discharge or drainage Head: Atraumatic Neck: Supple Respiratory: No cough. Respirations are even and unlabored. Musculoskeletal: Diffuse right ankle swelling. PT and DP pulses are intact. No open wounds noted. Compartments are soft. No swelling above injury. Toes are warm and demonstrated FROM. Neurologic: Motor and sensory intact.   Skin: Intact, warm, dry Psychiatric: Affect and behavior are appropriate.  ____________________________________________   LABS (all labs ordered are listed, but only abnormal results are displayed)  Labs Reviewed - No data to display ____________________________________________  RADIOLOGY  Not indicated. ____________________________________________   PROCEDURES  .Splint Application  Date/Time:  05/26/2019 6:47 PM Performed by: Victorino Dike, FNP Authorized by: Victorino Dike, FNP   Consent:    Consent obtained:  Verbal   Consent given by:  Patient   Risks discussed:  Pain and swelling Pre-procedure details:    Sensation:  Normal Procedure details:    Laterality:  Right   Location:  Ankle   Splint type:  Ankle stirrup   Supplies:  Cotton padding, elastic bandage and Ortho-Glass Post-procedure details:    Pain:  Unchanged   Sensation:   Normal   Patient tolerance of procedure:  Tolerated well, no immediate complications    ____________________________________________   INITIAL IMPRESSION / ASSESSMENT AND PLAN / ED COURSE  Sara Reed is a 59 y.o. who presents to the emergency department for splint feeling too tight. She is also noted to be hypertensive today. She has taken her medications today, but states that she is "nervous" about having to come back to the hospital as well as upcoming surgery, in addition to pain from fracture. She is asymptomatic.  Differential diagnosis includes but not limited to: Compartment syndrome, swelling secondary to fracture, hypertension.  Splint reapplied. Patient advised that her blood pressure is high and she needs to follow up with PCP. For symptoms of concern, she is to return to the ER. For her ankle injury, she has surgery planned in 2 days.  Medications - No data to display  Pertinent labs & imaging results that were available during my care of the patient were reviewed by me and considered in my medical decision making (see chart for details).  _________________________________________   FINAL CLINICAL IMPRESSION(S) / ED DIAGNOSES  Final diagnoses:  Cast discomfort  Hypertension, unspecified type    ED Discharge Orders    None       If controlled substance prescribed during this visit, 12 month history viewed on the [place] prior to issuing an initial prescription for Schedule II or III opiod.   Victorino Dike, FNP 05/26/19 Elberta Spaniel, MD 05/26/19 2135

## 2019-05-26 NOTE — Discharge Instructions (Signed)
Keep leg elevated and apply ice pack off and on for the next few days. If the splint feels too tight, loosen the ACE bandage and apply it more loosely. Do not take the hard material off.  Keep your scheduled appointment with Dr. Ernest Pine. Return to the ER for symptoms of concern.

## 2019-05-26 NOTE — ED Triage Notes (Signed)
Pt had a splint placed on right ankle and states that she thinks it is too tight.  Removed splint and pt states that it feels much better already.  Pt states that her toes "did not feel right"

## 2019-05-26 NOTE — ED Notes (Signed)
This tech applied an ankle stirrup splint to pts right leg. Gave pt instructions on how to care for splint and pt verbalized understanding of instructions. Pt tolerated well.

## 2019-06-01 ENCOUNTER — Encounter
Admission: RE | Admit: 2019-06-01 | Discharge: 2019-06-01 | Disposition: A | Discharge status: Home / Self Care | Payer: Medicaid Other | Source: Ambulatory Visit | Attending: Orthopedic Surgery | Admitting: Orthopedic Surgery

## 2019-06-01 ENCOUNTER — Other Ambulatory Visit: Payer: Self-pay

## 2019-06-01 DIAGNOSIS — Z01812 Encounter for preprocedural laboratory examination: Secondary | ICD-10-CM | POA: Insufficient documentation

## 2019-06-01 NOTE — Patient Instructions (Addendum)
Your procedure is scheduled on: Friday 06/04/19.  Report to DAY SURGERY DEPARTMENT LOCATED ON 2ND FLOOR MEDICAL MALL ENTRANCE. To find out your arrival time please call [phone] between 1PM - 3PM on Thursday 06/03/19.   Remember: Instructions that are not followed completely may result in serious medical risk, up to and including death, or upon the discretion of your surgeon and anesthesiologist your surgery may need to be rescheduled.      _X__ 1. Do not eat food after midnight the night before your procedure.                 No gum chewing or hard candies. You may drink clear liquids up to 2 hours                 before you are scheduled to arrive for your surgery- DO NOT drink clear                 liquids within 2 hours of the start of your surgery.                 Clear Liquids include:  water, apple juice without pulp, clear carbohydrate                 drink such as Clearfast or Gatorade, Black Coffee or Tea (Do not add                 anything to coffee or tea).   __X__2.  On the morning of surgery brush your teeth with toothpaste and water, you may rinse your mouth with mouthwash if you wish.  Do not swallow any toothpaste or mouthwash.      _X__ 3.  No Alcohol for 24 hours before or after surgery.     _X__ 4.  Do Not Smoke or use e-cigarettes For 24 Hours Prior to Your Surgery.                 Do not use any chewable tobacco products for at least 6 hours prior to                 surgery.     __X__6.  Notify your doctor if there is any change in your medical condition      (cold, fever, infections).       Do not wear jewelry, make-up, hairpins, clips or nail polish. Do not wear lotions, powders, or perfumes.  Do not shave 48 hours prior to surgery. Men may shave face and neck. Do not bring valuables to the hospital.     [facility] [place] is not responsible for any belongings or valuables.    Contacts, dentures/partials or body piercings may not be worn into surgery.  Bring a case for your contacts, glasses or hearing aids, a denture cup will be supplied.    Patients discharged the day of surgery will not be allowed to drive home.     __X__ Take these medicines the morning of surgery with A SIP OF WATER:     1. oxyCODONE-acetaminophen (PERCOCET/ROXICET) 5-325 MG tablet if needed    __X__ Use SAGE wipes as directed    __X__ Stop Anti-inflammatories 7 days before surgery such as Advil, Ibuprofen, Motrin, BC or Goodies Powder, Naprosyn, Naproxen, Aleve, Aspirin, Meloxicam. May take Tylenol if needed for pain or discomfort.     __X__ Don't start taking any new herbal supplements before your surgery.

## 2019-06-02 ENCOUNTER — Encounter
Admission: RE | Admit: 2019-06-02 | Discharge: 2019-06-02 | Disposition: A | Discharge status: Home / Self Care | Payer: Medicaid Other | Source: Ambulatory Visit | Attending: Orthopedic Surgery | Admitting: Orthopedic Surgery

## 2019-06-02 DIAGNOSIS — Z20822 Contact with and (suspected) exposure to covid-19: Secondary | ICD-10-CM | POA: Insufficient documentation

## 2019-06-02 DIAGNOSIS — I10 Essential (primary) hypertension: Secondary | ICD-10-CM | POA: Insufficient documentation

## 2019-06-02 DIAGNOSIS — R9431 Abnormal electrocardiogram [ECG] [EKG]: Secondary | ICD-10-CM | POA: Insufficient documentation

## 2019-06-02 DIAGNOSIS — Z01818 Encounter for other preprocedural examination: Secondary | ICD-10-CM | POA: Insufficient documentation

## 2019-06-02 LAB — CBC
HCT: 32.3 % — ABNORMAL LOW (ref 36.0–46.0)
Hemoglobin: 10.4 g/dL — ABNORMAL LOW (ref 12.0–15.0)
MCH: 30 pg (ref 26.0–34.0)
MCHC: 32.2 g/dL (ref 30.0–36.0)
MCV: 93.1 fL (ref 80.0–100.0)
Platelets: 336 10*3/uL (ref 150–400)
RBC: 3.47 MIL/uL — ABNORMAL LOW (ref 3.87–5.11)
RDW: 14.8 % (ref 11.5–15.5)
WBC: 6.6 10*3/uL (ref 4.0–10.5)
nRBC: 0 % (ref 0.0–0.2)

## 2019-06-02 LAB — BASIC METABOLIC PANEL
Anion gap: 9 (ref 5–15)
BUN: 14 mg/dL (ref 6–20)
CO2: 26 mmol/L (ref 22–32)
Calcium: 9.5 mg/dL (ref 8.9–10.3)
Chloride: 107 mmol/L (ref 98–111)
Creatinine, Ser: 0.87 mg/dL (ref 0.44–1.00)
GFR calc Af Amer: >60 mL/min (ref >60)
GFR calc non Af Amer: >60 mL/min (ref >60)
Glucose, Bld: 101 mg/dL — ABNORMAL HIGH (ref 70–99)
Potassium: 3.9 mmol/L (ref 3.5–5.1)
Sodium: 142 mmol/L (ref 135–145)

## 2019-06-02 LAB — SARS CORONAVIRUS 2 (TAT 6-24 HRS): SARS Coronavirus 2: NEGATIVE

## 2019-06-02 NOTE — Pre-Procedure Instructions (Signed)
Pre-Admit Testing Provider Notification Note  Provider Notified: Dr. Ernest Pine  Notification Mode: Fax  Reason: Abnormal CBC results.  Response: Fax confirmation received.  Additional Information: Placed on chart. Noted on Pre-Admit worksheet.  Signed: Alvester Morin, RN

## 2019-06-04 ENCOUNTER — Encounter: Payer: Self-pay | Admitting: Orthopedic Surgery

## 2019-06-04 ENCOUNTER — Ambulatory Visit: Payer: Self-pay | Admitting: Family

## 2019-06-04 ENCOUNTER — Other Ambulatory Visit: Payer: Self-pay

## 2019-06-04 ENCOUNTER — Encounter
Admission: RE | Disposition: A | Discharge status: Home / Self Care | Payer: Self-pay | Source: Home / Self Care | Attending: Orthopedic Surgery

## 2019-06-04 ENCOUNTER — Ambulatory Visit
Admission: RE | Admit: 2019-06-04 | Discharge: 2019-06-04 | Disposition: A | Discharge status: Home / Self Care | Payer: Self-pay | Attending: Orthopedic Surgery | Admitting: Orthopedic Surgery

## 2019-06-04 DIAGNOSIS — X501XXA Overexertion from prolonged static or awkward postures, initial encounter: Secondary | ICD-10-CM | POA: Insufficient documentation

## 2019-06-04 DIAGNOSIS — S8261XA Displaced fracture of lateral malleolus of right fibula, initial encounter for closed fracture: Secondary | ICD-10-CM | POA: Insufficient documentation

## 2019-06-04 DIAGNOSIS — I10 Essential (primary) hypertension: Secondary | ICD-10-CM | POA: Insufficient documentation

## 2019-06-04 DIAGNOSIS — Z7982 Long term (current) use of aspirin: Secondary | ICD-10-CM | POA: Insufficient documentation

## 2019-06-04 DIAGNOSIS — Z79899 Other long term (current) drug therapy: Secondary | ICD-10-CM | POA: Insufficient documentation

## 2019-06-04 DIAGNOSIS — S93431A Sprain of tibiofibular ligament of right ankle, initial encounter: Secondary | ICD-10-CM | POA: Insufficient documentation

## 2019-06-04 HISTORY — PX: ORIF ANKLE FRACTURE: SHX5408

## 2019-06-04 SURGERY — OPEN REDUCTION INTERNAL FIXATION (ORIF) ANKLE FRACTURE
Anesthesia: General | Site: Ankle | Laterality: Right

## 2019-06-04 MED ORDER — LIDOCAINE HCL (PF) 2 % IJ SOLN
INTRAMUSCULAR | Status: AC
  Filled 2019-06-04: qty 10

## 2019-06-04 MED ORDER — PHENYLEPHRINE HCL (PRESSORS) 10 MG/ML IV SOLN
INTRAVENOUS | Status: DC | PRN
  Administered 2019-06-04: 100 ug via INTRAVENOUS

## 2019-06-04 MED ORDER — SEVOFLURANE IN SOLN
RESPIRATORY_TRACT | Status: AC
  Filled 2019-06-04: qty 250

## 2019-06-04 MED ORDER — PROPOFOL 10 MG/ML IV BOLUS
INTRAVENOUS | Status: DC | PRN
  Administered 2019-06-04: 180 mg via INTRAVENOUS

## 2019-06-04 MED ORDER — HYDROMORPHONE HCL 1 MG/ML IJ SOLN
INTRAMUSCULAR | Status: AC
  Filled 2019-06-04: qty 1

## 2019-06-04 MED ORDER — LABETALOL HCL 5 MG/ML IV SOLN
INTRAVENOUS | Status: DC | PRN
  Administered 2019-06-04: 10 mg via INTRAVENOUS

## 2019-06-04 MED ORDER — HYDRALAZINE HCL 20 MG/ML IJ SOLN
INTRAMUSCULAR | Status: AC
  Filled 2019-06-04: qty 1

## 2019-06-04 MED ORDER — ACETAMINOPHEN 10 MG/ML IV SOLN
INTRAVENOUS | Status: AC
  Filled 2019-06-04: qty 100

## 2019-06-04 MED ORDER — SUGAMMADEX SODIUM 200 MG/2ML IV SOLN
INTRAVENOUS | Status: DC | PRN
  Administered 2019-06-04: 100 mg via INTRAVENOUS

## 2019-06-04 MED ORDER — FENTANYL CITRATE (PF) 100 MCG/2ML IJ SOLN
INTRAMUSCULAR | Status: AC
  Filled 2019-06-04: qty 2

## 2019-06-04 MED ORDER — MIDAZOLAM HCL 2 MG/2ML IJ SOLN: 2.0000 mg | Freq: Once | INTRAMUSCULAR | Status: AC

## 2019-06-04 MED ORDER — NEOMYCIN-POLYMYXIN B GU 40-200000 IR SOLN
Status: DC | PRN
  Administered 2019-06-04: 2 mL

## 2019-06-04 MED ORDER — BUPIVACAINE HCL (PF) 0.25 % IJ SOLN
INTRAMUSCULAR | Status: AC
  Filled 2019-06-04: qty 30

## 2019-06-04 MED ORDER — ACETAMINOPHEN 10 MG/ML IV SOLN
INTRAVENOUS | Status: DC | PRN
  Administered 2019-06-04: 1000 mg via INTRAVENOUS

## 2019-06-04 MED ORDER — HYDRALAZINE HCL 20 MG/ML IJ SOLN
5.0000 mg | Freq: Once | INTRAMUSCULAR | Status: AC
  Filled 2019-06-04: qty 0.25

## 2019-06-04 MED ORDER — FENTANYL CITRATE (PF) 100 MCG/2ML IJ SOLN
INTRAMUSCULAR | Status: DC | PRN
  Administered 2019-06-04: 100 ug via INTRAVENOUS

## 2019-06-04 MED ORDER — PROPOFOL 10 MG/ML IV BOLUS
INTRAVENOUS | Status: AC
  Filled 2019-06-04: qty 40

## 2019-06-04 MED ORDER — OXYCODONE HCL 5 MG/5ML PO SOLN: 5.0000 mg | Freq: Once | ORAL | Status: DC | PRN

## 2019-06-04 MED ORDER — FAMOTIDINE 20 MG PO TABS
ORAL_TABLET | ORAL | Status: AC
  Administered 2019-06-04: 20 mg via ORAL
  Filled 2019-06-04: qty 1

## 2019-06-04 MED ORDER — LIDOCAINE HCL (CARDIAC) PF 100 MG/5ML IV SOSY
PREFILLED_SYRINGE | INTRAVENOUS | Status: DC | PRN
  Administered 2019-06-04: 100 mg via INTRAVENOUS

## 2019-06-04 MED ORDER — LABETALOL HCL 5 MG/ML IV SOLN: 10.0000 mg | Freq: Once | INTRAVENOUS | Status: DC

## 2019-06-04 MED ORDER — SUGAMMADEX SODIUM 200 MG/2ML IV SOLN
INTRAVENOUS | Status: AC
  Filled 2019-06-04: qty 2

## 2019-06-04 MED ORDER — ONDANSETRON HCL 4 MG/2ML IJ SOLN
INTRAMUSCULAR | Status: DC | PRN
  Administered 2019-06-04: 4 mg via INTRAVENOUS

## 2019-06-04 MED ORDER — ONDANSETRON HCL 4 MG/2ML IJ SOLN
INTRAMUSCULAR | Status: AC
  Filled 2019-06-04: qty 2

## 2019-06-04 MED ORDER — OXYCODONE HCL 5 MG PO TABS: 5.0000 mg | ORAL_TABLET | Freq: Once | ORAL | Status: DC | PRN

## 2019-06-04 MED ORDER — NEOMYCIN-POLYMYXIN B GU 40-200000 IR SOLN
Status: AC
  Filled 2019-06-04: qty 20

## 2019-06-04 MED ORDER — HYDROMORPHONE HCL 1 MG/ML IJ SOLN
INTRAMUSCULAR | Status: DC | PRN
  Administered 2019-06-04: .2 mg via INTRAVENOUS
  Administered 2019-06-04: .4 mg via INTRAVENOUS

## 2019-06-04 MED ORDER — CHLORHEXIDINE GLUCONATE 4 % EX LIQD
60.0000 mL | Freq: Once | CUTANEOUS | Status: AC
  Administered 2019-06-04: 4 via TOPICAL

## 2019-06-04 MED ORDER — FAMOTIDINE 20 MG PO TABS: 20.0000 mg | ORAL_TABLET | Freq: Once | ORAL | Status: AC

## 2019-06-04 MED ORDER — LABETALOL HCL 5 MG/ML IV SOLN
INTRAVENOUS | Status: AC
  Administered 2019-06-04: 10 mg via INTRAVENOUS
  Filled 2019-06-04: qty 4

## 2019-06-04 MED ORDER — BUPIVACAINE HCL 0.25 % IJ SOLN
INTRAMUSCULAR | Status: DC | PRN
  Administered 2019-06-04: 10 mL

## 2019-06-04 MED ORDER — MIDAZOLAM HCL 2 MG/2ML IJ SOLN
INTRAMUSCULAR | Status: AC
  Administered 2019-06-04: 07:00:00 2 mg via INTRAVENOUS
  Filled 2019-06-04: qty 2

## 2019-06-04 MED ORDER — CEFAZOLIN SODIUM-DEXTROSE 2-4 GM/100ML-% IV SOLN
INTRAVENOUS | Status: AC
  Filled 2019-06-04: qty 100

## 2019-06-04 MED ORDER — CEFAZOLIN SODIUM-DEXTROSE 2-4 GM/100ML-% IV SOLN
2.0000 g | INTRAVENOUS | Status: AC
  Administered 2019-06-04: 2 g via INTRAVENOUS

## 2019-06-04 MED ORDER — LACTATED RINGERS IV SOLN: INTRAVENOUS | Status: DC

## 2019-06-04 MED ORDER — FENTANYL CITRATE (PF) 100 MCG/2ML IJ SOLN: 25.0000 ug | INTRAMUSCULAR | Status: DC | PRN

## 2019-06-04 MED ORDER — MIDAZOLAM HCL 2 MG/2ML IJ SOLN
INTRAMUSCULAR | Status: AC
  Filled 2019-06-04: qty 2

## 2019-06-04 MED ORDER — HYDRALAZINE HCL 20 MG/ML IJ SOLN
5.0000 mg | Freq: Once | INTRAMUSCULAR | Status: AC
  Administered 2019-06-04: 5 mg via INTRAVENOUS

## 2019-06-04 MED ORDER — OXYCODONE HCL 5 MG PO TABS: 5.0000 mg | ORAL_TABLET | ORAL | 0 refills | Status: DC | PRN

## 2019-06-04 MED ORDER — CELECOXIB 200 MG PO CAPS
ORAL_CAPSULE | ORAL | Status: AC
  Administered 2019-06-04: 400 mg via ORAL
  Filled 2019-06-04: qty 2

## 2019-06-04 MED ORDER — HYDRALAZINE HCL 20 MG/ML IJ SOLN
INTRAMUSCULAR | Status: AC
  Administered 2019-06-04: 5 mg via INTRAVENOUS
  Filled 2019-06-04: qty 1

## 2019-06-04 MED ORDER — ROCURONIUM BROMIDE 50 MG/5ML IV SOLN
INTRAVENOUS | Status: AC
  Filled 2019-06-04: qty 1

## 2019-06-04 MED ORDER — ROCURONIUM BROMIDE 100 MG/10ML IV SOLN
INTRAVENOUS | Status: DC | PRN
  Administered 2019-06-04: 50 mg via INTRAVENOUS

## 2019-06-04 MED ORDER — DEXAMETHASONE SODIUM PHOSPHATE 10 MG/ML IJ SOLN
INTRAMUSCULAR | Status: DC | PRN
  Administered 2019-06-04: 10 mg via INTRAVENOUS

## 2019-06-04 MED ORDER — CELECOXIB 200 MG PO CAPS: 400.0000 mg | ORAL_CAPSULE | Freq: Once | ORAL | Status: AC

## 2019-06-04 MED ORDER — LABETALOL HCL 5 MG/ML IV SOLN
10.0000 mg | Freq: Once | INTRAVENOUS | Status: AC
  Filled 2019-06-04: qty 4

## 2019-06-04 SURGICAL SUPPLY — 48 items
BNDG COHESIVE 4X5 TAN STRL (GAUZE/BANDAGES/DRESSINGS) ×3 IMPLANT
BNDG ELASTIC 4X5.8 VLCR STR LF (GAUZE/BANDAGES/DRESSINGS) ×3 IMPLANT
BNDG ESMARK 6X12 TAN STRL LF (GAUZE/BANDAGES/DRESSINGS) ×3 IMPLANT
COOLER POLAR GLACIER W/PUMP (MISCELLANEOUS) ×3 IMPLANT
COVER WAND RF STERILE (DRAPES) ×3 IMPLANT
CUFF TOURN SGL QUICK 24 (TOURNIQUET CUFF)
CUFF TOURN SGL QUICK 30 (TOURNIQUET CUFF) ×2
CUFF TRNQT CYL 24X4X16.5-23 (TOURNIQUET CUFF) IMPLANT
CUFF TRNQT CYL 30X4X21-28X (TOURNIQUET CUFF) ×1 IMPLANT
DRAPE FLUOR MINI C-ARM 54X84 (DRAPES) ×3 IMPLANT
DRSG DERMACEA 8X12 NADH (GAUZE/BANDAGES/DRESSINGS) ×3 IMPLANT
DURAPREP 26ML APPLICATOR (WOUND CARE) ×3 IMPLANT
ELECT CAUTERY BLADE 6.4 (BLADE) ×3 IMPLANT
ELECT REM PT RETURN 9FT ADLT (ELECTROSURGICAL) ×3
ELECTRODE REM PT RTRN 9FT ADLT (ELECTROSURGICAL) ×1 IMPLANT
GAUZE SPONGE 4X4 12PLY STRL (GAUZE/BANDAGES/DRESSINGS) ×3 IMPLANT
GLOVE BIOGEL M STRL SZ7.5 (GLOVE) ×3 IMPLANT
GLOVE INDICATOR 8.0 STRL GRN (GLOVE) ×3 IMPLANT
GOWN STRL REUS W/ TWL LRG LVL3 (GOWN DISPOSABLE) ×2 IMPLANT
GOWN STRL REUS W/TWL LRG LVL3 (GOWN DISPOSABLE) ×4
KIT TURNOVER KIT A (KITS) ×3 IMPLANT
LABEL OR SOLS (LABEL) IMPLANT
PACK EXTREMITY ARMC (MISCELLANEOUS) ×3 IMPLANT
PAD ABD DERMACEA PRESS 5X9 (GAUZE/BANDAGES/DRESSINGS) ×9 IMPLANT
PAD CAST CTTN 4X4 STRL (SOFTGOODS) ×3 IMPLANT
PAD PREP 24X41 OB/GYN DISP (PERSONAL CARE ITEMS) ×3 IMPLANT
PAD WRAPON POLAR ANKLE (MISCELLANEOUS) ×1 IMPLANT
PADDING CAST COTTON 4X4 STRL (SOFTGOODS) ×6
PLATE LCP 3.5 1/3 TUB 7HX81 (Plate) ×3 IMPLANT
SCREW CANC FT/18 4.0 (Screw) ×3 IMPLANT
SCREW CORTEX 3.5 12MM (Screw) ×4 IMPLANT
SCREW CORTEX 3.5 14MM (Screw) ×2 IMPLANT
SCREW CORTEX 3.5 16MM (Screw) ×2 IMPLANT
SCREW CORTEX 3.5 18MM (Screw) ×2 IMPLANT
SCREW CORTEX 3.5 20MM (Screw) ×2 IMPLANT
SCREW LOCK CORT ST 3.5X12 (Screw) ×2 IMPLANT
SCREW LOCK CORT ST 3.5X14 (Screw) ×1 IMPLANT
SCREW LOCK CORT ST 3.5X16 (Screw) ×1 IMPLANT
SCREW LOCK CORT ST 3.5X18 (Screw) ×1 IMPLANT
SCREW LOCK CORT ST 3.5X20 (Screw) ×1 IMPLANT
SPLINT CAST 1 STEP 5X30 WHT (MISCELLANEOUS) ×3 IMPLANT
SPONGE LAP 18X18 RF (DISPOSABLE) ×6 IMPLANT
STAPLER SKIN PROX 35W (STAPLE) ×3 IMPLANT
STOCKINETTE STRL 6IN 960660 (GAUZE/BANDAGES/DRESSINGS) ×3 IMPLANT
SUT VIC AB 0 CT1 36 (SUTURE) ×6 IMPLANT
SUT VIC AB 2-0 SH 27 (SUTURE) ×2
SUT VIC AB 2-0 SH 27XBRD (SUTURE) ×1 IMPLANT
WRAPON POLAR PAD ANKLE (MISCELLANEOUS) ×3

## 2019-06-04 NOTE — Discharge Instructions (Signed)
AMBULATORY SURGERY  DISCHARGE INSTRUCTIONS   1) The drugs that you were given will stay in your system until tomorrow so for the next 24 hours you should not:  A) Drive an automobile B) Make any legal decisions C) Drink any alcoholic beverage   2) You may resume regular meals tomorrow.  Today it is better to start with liquids and gradually work up to solid foods.  You may eat anything you prefer, but it is better to start with liquids, then soup and crackers, and gradually work up to solid foods.   3) Please notify your doctor immediately if you have any unusual bleeding, trouble breathing, redness and pain at the surgery site, drainage, fever, or pain not relieved by medication.    4) Additional Instructions:        Please contact your physician with any problems or Same Day Surgery at [phone], Monday through Friday 6 am to 4 pm, or [facility] Main number at [phone]. Instructions for Ankle Fracture   James P. Hooten, Jr., M.D.     Dept. of Orthopaedics & Sports Medicine  [facility]  [address]  [place], [place]  [address]   Phone: [phone]   Fax: [phone]   ACTIVITY:  . You may use crutches or a walker with NO weight-bearing to the affected leg  WOUND CARE:  . Place one to two pillows under the left foot and ankle when sitting or lying.  . Continue to use the ice packs or the Polar Care to reduce pain and swelling. . Keep the left splint clean and dry.  MEDICATIONS: . You may resume your regular medications. . Please take the pain medication as prescribed. . Do not take pain medication on an empty stomach. . Do not drive or drink alcoholic beverages when taking pain medications.  CALL THE OFFICE FOR: . Temperature above 101 degrees . Excessive bleeding or drainage on the dressing. . Excessive swelling, coldness, or paleness of the toes. . Persistent nausea and vomiting.  FOLLOW-UP:  . You should have an  appointment to return to the office in 7-10 days.    

## 2019-06-04 NOTE — Anesthesia Preprocedure Evaluation (Signed)
Anesthesia Evaluation  Patient identified by MRN, date of birth, ID band Patient awake    Reviewed: Allergy & Precautions, H&P , NPO status , Patient's Chart, lab work & pertinent test results  Airway Mallampati: III  TM Distance: >3 FB Neck ROM: full    Dental  (+) Partial Upper   Pulmonary neg pulmonary ROS, neg COPD,           Cardiovascular hypertension, (-) angina(-) Past MI and (-) Cardiac Stents (-) dysrhythmias      Neuro/Psych neg Headaches, negative neurological ROS  negative psych ROS   GI/Hepatic negative GI ROS, Neg liver ROS,   Endo/Other  negative endocrine ROS  Renal/GU      Musculoskeletal   Abdominal   Peds  Hematology negative hematology ROS (+)   Anesthesia Other Findings Past Medical History: No date: Hypertension  History reviewed. No pertinent surgical history.     Reproductive/Obstetrics negative OB ROS                             Anesthesia Physical Anesthesia Plan  ASA: III  Anesthesia Plan: General ETT   Post-op Pain Management:    Induction:   PONV Risk Score and Plan: Ondansetron, Dexamethasone, Midazolam and Treatment may vary due to age or medical condition  Airway Management Planned:   Additional Equipment:   Intra-op Plan:   Post-operative Plan:   Informed Consent: I have reviewed the patients History and Physical, chart, labs and discussed the procedure including the risks, benefits and alternatives for the proposed anesthesia with the patient or authorized representative who has indicated his/her understanding and acceptance.     Dental Advisory Given  Plan Discussed with: Anesthesiologist  Anesthesia Plan Comments: (Pt extremely hypertensive in pre-op, likely poorly controlled chronic HTN with superimposed anxiety.  Denies H/A, vision change, CP, SOB, pain.  As this is not a purely elective case, will attempt to lower BP in a slow  titrated fashion in pre-operative area and monitor for end-organ symptoms prior to proceeding with GA.   Pt consented for post-op block PRN.)        Anesthesia Quick Evaluation

## 2019-06-04 NOTE — Op Note (Signed)
OPERATIVE NOTE  DATE OF SURGERY:  06/04/2019  PATIENT NAME:  Sara Reed   DOB: [birth year]  MRN: [id number]  PRE-OPERATIVE DIAGNOSIS: Right lateral malleolar ankle fracture  POST-OPERATIVE DIAGNOSIS:  Same  PROCEDURE:  Open reduction and internal fixation of the right lateral malleolar ankle fracture  SURGEON:  Jena Gauss. M.D.  ANESTHESIA: general  ESTIMATED BLOOD LOSS: 50 ml  FLUIDS REPLACED: 1000 mL of crystalloid  TOURNIQUET TIME: 18 minutes  DRAINS: None  IMPLANTS UTILIZED: Synthes 7 hole 1/3 tubular plate, 6 - 3.5 mm cortical screws, 1 - 4.0 mm fully threaded cancellous screw  INDICATIONS FOR SURGERY: FAREN FLORENCE is a 59 y.o. year old female who sustained a right lateral malleolar ankle fracture. After discussion of the risks and benefits of surgical intervention, the patient expressed understanding of the risks benefits and agree with plans for open reduction and internal fixation.   PROCEDURE IN DETAIL: The patient was brought into the operating room and after adequate general anesthesia, a tourniquet was placed on the patient's right thigh.The right foot and ankle were prepped with alcohol and Duraprep and draped in the usual sterile fashion. A "time-out" was performed as per usual protocol. The foot and ankle were exsanguinated using an Esmarch and the tourniquet was inflated to 300 mmHg. A lateral longitudinal incision was made in line with the distal fibula. Dissection was carried down to the fracture site and the site was debrided of hematoma and soft tissue.  The patient's blood pressure was elevated, thus creating a venous tourniquet.  The tourniquet was deflated after total tourniquet time of 18 minutes.  A provisional reduction of the distal fibular fracture was performed and position visualized using the FluoroScan. A 7 hole one third tubular plate was contoured to fit along the lateral aspect of the distal fibula. The plate was then secured using a  combination ofsix 3.5 mm cortical screws and one 4.0 mm fully threaded cancellous screws. Good reduction and hardware placement was noted using the FluoroScan.  Attempt was made to distract the lateral malleolus with a hook while visualizing using the FluoroScan and there was no widening of the ankle mortise.  The lateral incision was then closed in layers using first #0 Vicryl followed by #2-0 Vicryl.  Good reduction with restoration of the ankle mortise and good position of hardware was noted in multiple planes using FluoroScan. Skin edges were then reapproximated using skin staples. A sterile dressing was applied followed by application of a posterior splint.   The patient tolerated the procedure well and was transported to the PACU in stable condition.  Ellan Tess P. Angie Fava., M.D.

## 2019-06-04 NOTE — Transfer of Care (Signed)
Immediate Anesthesia Transfer of Care Note  Patient: Sara Reed  Procedure(s) Performed: OPEN REDUCTION INTERNAL FIXATION WITH DISTAL FIBULA PLATE AND SYNDESMOTIC REPAIR OF RIGHT ANKLE. (Right Ankle)  Patient Location: PACU  Anesthesia Type:General  Level of Consciousness: drowsy  Airway & Oxygen Therapy: Patient Spontanous Breathing and Patient connected to face mask oxygen  Post-op Assessment: Report given to RN and Post -op Vital signs reviewed and stable  Post vital signs: Reviewed and stable  Last Vitals:  Vitals Value Taken Time  BP 179/102 06/04/19 0953  Temp    Pulse 76 06/04/19 0955  Resp 19 06/04/19 0955  SpO2 100 % 06/04/19 0955  Vitals shown include unvalidated device data.  Last Pain:  Vitals:   06/04/19 9409  TempSrc: Tympanic  PainSc: 0-No pain         Complications: No apparent anesthesia complications

## 2019-06-04 NOTE — Progress Notes (Signed)
Pt given and instructed on incentive spirometer. Pt demonstrated understanding with an average demonstration of 1750 ml.

## 2019-06-04 NOTE — H&P (Signed)
ORTHOPAEDIC HISTORY & PHYSICAL Progress Notes by Patience Musca, PA at 05/28/2019 2:00 PM  Chief Complaint:    Chief Complaint  Patient presents with  . Right Ankle - Pre-op Exam    Sara Reed is a 59 y.o. female who presents today for evaluation of right ankle fracture.  Patient states Tuesday morning 05/25/2019 she slipped on her steps in the rain, twisted her right ankle and developed significant pain and swelling.  She is unable to bear weight and went to the emergency department x-rays showed distal fibula fracture with syndesmosis rupture.  Pain has been mild to moderate.  Pain control with oxycodone.  She has been keeping the lower extremity elevated.  Tolerating crutches well.  No numbness or tingling.  No calf pain.  Pain is along the distal fibula and mild pain along the medial aspect of ankle.  No history of previous ankle surgery or trauma.  No history of blood clots.  She denies any past medical history except for hypertension, is currently on blood pressure medication.    Past Medical History: History reviewed. No pertinent past medical history.  Past Surgical History: History reviewed. No pertinent surgical history.  Past Family History: Family History  History reviewed. No pertinent family history.    Medications:       Current Outpatient Medications Ordered in Epic  Medication Sig Dispense Refill  . amLODIPine (NORVASC) 5 MG tablet Take by mouth    . aspirin 81 MG EC tablet Take by mouth    . lisinopriL-hydrochlorothiazide (ZESTORETIC) 20-25 mg tablet Take by mouth    . oxyCODONE-acetaminophen (PERCOCET) 5-325 mg tablet Take by mouth     No current Epic-ordered facility-administered medications on file.     Allergies: No Known Allergies   Review of Systems:  A comprehensive 14 point ROS was performed, reviewed by me today, and the pertinent orthopaedic findings are documented in the HPI.   Exam: BP 138/82   Wt 95.9 kg  (211 lb 8 oz)   BMI 34.82 kg/m  General:  Well developed, well nourished, no apparent distress, normal affect, normal gait with no antalgic component.   HEENT: Head normocephalic, atraumatic, PERRL.   Abdomen: Soft, non tender, non distended, Bowel sounds present.  Heart: Examination of the heart reveals regular, rate, and rhythm.  There is no murmur noted on ascultation.  There is a normal apical pulse.  Lungs: Lungs are clear to auscultation.  There is no wheeze, rhonchi, or crackles.  There is normal expansion of bilateral chest walls.    Examination of the right lower extremity shows mild swelling throughout the medial lateral aspect of the ankle.  No skin breakdown noted, warmth redness or fracture blisters noted.  Ankle plantar flexion dorsiflexion is intact.  Sensation intact distally.  2+ dorsalis pedis pulse.  No swelling or edema throughout the calf.   AP lateral oblique and gravity stress views of right ankle are ordered interpreted by me in the office today.  Impression: Patient has spiral distal fibula fracture that is above the ankle mortise line.  There is mild displacement of the distal fibula fracture.  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  No other evidence of acute bony abnormality.  Gravity Stress view shows significant increase in widening of the medial tibiotalar joint space   Impression: Displaced fracture of distal end of fibula [S82.839A] Displaced fracture of distal end of fibula  (primary encounter diagnosis) Ankle syndesmosis disruption, right, initial encounter  Plan:  1.  Risks, benefits, complications of a right ankle open reduction internal fixation of the distal fibula fracture with syndesmotic repair have been discussed with the patient.  Patient has agreed and consented to the procedure with Dr. Marry Guan.  This note was generated in part with voice recognition software and I apologize for any typographical  errors that were not detected and corrected.   Feliberto Gottron MPA-C      Electronically signed by Feliberto Gottron, [place] on 05/28/2019 4:31 PM

## 2019-06-04 NOTE — H&P (Signed)
The patient has been re-examined, and the chart reviewed, and there have been no interval changes to the documented history and physical.    The risks, benefits, and alternatives have been discussed at length. The patient expressed understanding of the risks benefits and agreed with plans for surgical intervention.  Marceline Napierala P. Hebah Bogosian, Jr. M.D.    

## 2019-06-04 NOTE — Anesthesia Procedure Notes (Signed)
Procedure Name: Intubation Date/Time: 06/04/2019 7:51 AM Performed by: Manning Charity, CRNA Pre-anesthesia Checklist: Patient identified, Emergency Drugs available, Suction available and Patient being monitored Patient Re-evaluated:Patient Re-evaluated prior to induction Oxygen Delivery Method: Circle system utilized Preoxygenation: Pre-oxygenation with 100% oxygen Induction Type: IV induction and Cricoid Pressure applied Ventilation: Mask ventilation without difficulty Laryngoscope Size: McGraph and 3 Grade View: Grade III Tube type: Oral Tube size: 7.0 mm Number of attempts: 2 Airway Equipment and Method: Stylet Placement Confirmation: ETT inserted through vocal cords under direct vision,  positive ETCO2 and breath sounds checked- equal and bilateral Secured at: 18 cm Tube secured with: Tape Dental Injury: Teeth and Oropharynx as per pre-operative assessment

## 2019-06-04 NOTE — Progress Notes (Signed)
Pt upon arrive with BP 200/112.  Dr. Orland Penman notified.  Labetalol 10 mg and Versed 2mg  ordered and given.  Pt BP rechecked 175/106.  Hydralazine 5 mg ordered and given.

## 2019-06-06 NOTE — Anesthesia Postprocedure Evaluation (Signed)
Anesthesia Post Note  Patient: LAURA CALDAS  Procedure(s) Performed: OPEN REDUCTION INTERNAL FIXATION WITH DISTAL FIBULA PLATE AND SYNDESMOTIC REPAIR OF RIGHT ANKLE. (Right Ankle)  Patient location during evaluation: PACU Anesthesia Type: General Level of consciousness: awake and alert Pain management: pain level controlled Vital Signs Assessment: post-procedure vital signs reviewed and stable Respiratory status: spontaneous breathing, nonlabored ventilation and respiratory function stable Cardiovascular status: blood pressure returned to baseline and stable Postop Assessment: no apparent nausea or vomiting Anesthetic complications: no     Last Vitals:  Vitals:   06/04/19 1030 06/04/19 1046  BP: (!) 173/97 (!) 169/98  Pulse: 83 87  Resp: 19 18  Temp: 36.4 C (!) 36.1 C  SpO2: 96% 100%    Last Pain:  Vitals:   06/04/19 1046  TempSrc: Temporal  PainSc: 0-No pain                 Karleen Hampshire

## 2020-05-02 ENCOUNTER — Other Ambulatory Visit: Payer: Self-pay

## 2020-05-02 ENCOUNTER — Ambulatory Visit (LOCAL_COMMUNITY_HEALTH_CENTER): Payer: Self-pay

## 2020-05-02 DIAGNOSIS — R7612 Nonspecific reaction to cell mediated immunity measurement of gamma interferon antigen response without active tuberculosis: Secondary | ICD-10-CM

## 2020-05-02 NOTE — Progress Notes (Unsigned)
TBS today in clinic.  Form sent for scanning.  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  Info retrieved from [facility]; back up from [facility] EMR. Richmond Campbell, RN

## 2020-05-03 DIAGNOSIS — R7612 Nonspecific reaction to cell mediated immunity measurement of gamma interferon antigen response without active tuberculosis: Secondary | ICD-10-CM | POA: Insufficient documentation

## 2020-05-05 ENCOUNTER — Other Ambulatory Visit: Payer: Self-pay

## 2021-01-22 ENCOUNTER — Emergency Department: Payer: Medicaid Other

## 2021-01-22 ENCOUNTER — Other Ambulatory Visit: Payer: Self-pay

## 2021-01-22 ENCOUNTER — Emergency Department
Admission: EM | Admit: 2021-01-22 | Discharge: 2021-01-22 | Disposition: A | Discharge status: Home / Self Care | Payer: Medicaid Other | Attending: Emergency Medicine | Admitting: Emergency Medicine

## 2021-01-22 DIAGNOSIS — U071 COVID-19: Secondary | ICD-10-CM | POA: Insufficient documentation

## 2021-01-22 DIAGNOSIS — Z7982 Long term (current) use of aspirin: Secondary | ICD-10-CM | POA: Insufficient documentation

## 2021-01-22 MED ORDER — PSEUDOEPH-BROMPHEN-DM 30-2-10 MG/5ML PO SYRP: 5.0000 mL | ORAL_SOLUTION | Freq: Four times a day (QID) | ORAL | 0 refills | Status: DC | PRN

## 2021-01-22 MED ORDER — ACETAMINOPHEN 325 MG PO TABS
650.0000 mg | ORAL_TABLET | Freq: Once | ORAL | Status: AC
  Administered 2021-01-22: 650 mg via ORAL
  Filled 2021-01-22: qty 2

## 2021-01-22 MED ORDER — NIRMATRELVIR/RITONAVIR (PAXLOVID)TABLET: 3.0000 | ORAL_TABLET | Freq: Two times a day (BID) | ORAL | 0 refills | Status: AC

## 2021-01-22 MED ORDER — ONDANSETRON 4 MG PO TBDP: 4.0000 mg | ORAL_TABLET | Freq: Three times a day (TID) | ORAL | 0 refills | Status: DC | PRN

## 2021-01-22 MED ORDER — ONDANSETRON 4 MG PO TBDP
4.0000 mg | ORAL_TABLET | Freq: Once | ORAL | Status: DC
  Filled 2021-01-22: qty 1

## 2021-01-22 MED ORDER — BENZONATATE 100 MG PO CAPS: ORAL_CAPSULE | ORAL | 0 refills | Status: DC

## 2021-01-22 NOTE — ED Triage Notes (Signed)
Pt come with c/o COVID+/ pt states she  took test last night. Pt here for her covid symptoms.

## 2021-01-22 NOTE — Discharge Instructions (Addendum)
The prescription meds as prescribed.  Continue to rest, hydrate, take Tylenol and Motrin for any fevers and body aches.  Return to the ED if needed.

## 2021-01-22 NOTE — ED Notes (Signed)
PA to triage to evaluate and d/c pt 

## 2021-01-22 NOTE — ED Provider Notes (Signed)
[facility] Emergency Department Provider Note ____________________________________________  Time seen: 1837  I have reviewed the triage vital signs and the nursing notes.  HISTORY  Chief Complaint  URI   HPI Sara Reed is a 60 y.o. female is up to the ED with positive COVID result from home test last night.  She presents today with complaints of fevers, cough, congestion.  She denies any nausea, vomiting, or diarrhea.  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 Authorizing Provider  benzonatate (TESSALON PERLES) 100 MG capsule Take 1-2 tabs TID prn cough 01/22/21  Yes Adea Geisel, Charlesetta Ivory, PA-C  brompheniramine-pseudoephedrine-DM 30-2-10 MG/5ML syrup Take 5 mLs by mouth 4 (four) times daily as needed. 01/22/21  Yes Glenda Kunst, Charlesetta Ivory, PA-C  nirmatrelvir/ritonavir EUA (PAXLOVID) 20 x 150 MG & 10 x 100MG  TABS Take 3 tablets by mouth 2 (two) times daily for 5 days. Patient GFR is WNL. Take nirmatrelvir (150 mg) two tablets twice daily for 5 days and ritonavir (100 mg) one tablet twice daily for 5 days. 01/22/21 01/27/21 Yes Shronda Boeh, 03/29/21, PA-C  ondansetron (ZOFRAN ODT) 4 MG disintegrating tablet Take 1 tablet (4 mg total) by mouth every 8 (eight) hours as needed. 01/22/21  Yes Devan Babino, 01/24/21, PA-C  amLODipine (NORVASC) 5 MG tablet Take 1 tablet (5 mg  total) by mouth daily. Patient not taking: Reported on 05/31/2019 06/24/17 05/31/19  07/29/19, MD  aspirin EC 81 MG tablet Take 81 mg by mouth daily.    [provider]  lisinopril-hydrochlorothiazide (PRINZIDE,ZESTORETIC) 20-25 MG tablet Take 1 tablet by mouth daily.    [provider]  oxyCODONE (ROXICODONE) 5 MG immediate release tablet Take 1 tablet (5 mg total) by mouth every 4 (four) hours as needed for severe pain. 06/04/19   Hooten, 08/02/19, MD  oxyCODONE-acetaminophen (PERCOCET/ROXICET) 5-325 MG tablet Take 1 tablet by mouth every 4 (four) hours as needed for severe pain.     [provider]    Allergies Patient has no known allergies.  Family History  Problem Relation Age of Onset   Breast cancer Neg Hx     Social History Social History   Tobacco Use   Smoking status: Never   Smokeless tobacco: Never  Vaping Use   Vaping Use: Never used  Substance Use Topics   Alcohol use: No   Drug use: No    Review of Systems  Constitutional: Positive for fever. Eyes: Negative for visual changes. ENT: Negative for sore throat. Cardiovascular: Negative for chest pain. Respiratory: Negative for shortness of breath.  Reports cough as above. Gastrointestinal: Negative for abdominal pain, vomiting and diarrhea. Genitourinary: Negative for dysuria. Musculoskeletal: Negative for back pain. Skin: Negative for rash. Neurological: Negative for headaches, focal weakness or numbness. ____________________________________________  PHYSICAL EXAM:  VITAL SIGNS: ED Triage Vitals  Enc Vitals Group     BP 01/22/21 1840 (!)  187/107     Pulse Rate 01/22/21 1840 100     Resp 01/22/21 1840 18     Temp 01/22/21 1840 (!) 101.1 F (38.4 C)     Temp Source 01/22/21 1840 Oral     SpO2 01/22/21 1840 99 %     Weight --      Height --      Head Circumference --      Peak Flow --      Pain Score 01/22/21 1824 4     Pain Loc --      Pain Edu? --      Excl. in [place]?  --     Constitutional: Alert and oriented. Well appearing and in no distress. Head: Normocephalic and atraumatic. Eyes: Conjunctivae are normal. Normal extraocular movements Cardiovascular: Normal rate, regular rhythm. Normal distal pulses. Respiratory: Normal respiratory effort. No wheezes/rales/rhonchi. Gastrointestinal: Soft and nontender. No distention. Musculoskeletal: Nontender with normal range of motion in all extremities.  Neurologic:  Normal gait without ataxia. Normal speech and language. No gross focal neurologic deficits are appreciated. Skin:  Skin is warm, dry and intact. No rash noted. Psychiatric: Mood and affect are normal. Patient exhibits appropriate insight and judgment. ____________________________________________    {LABS (pertinent positives/negatives)  ____________________________________________  {EKG  ____________________________________________   RADIOLOGY Official radiology report(s): DG Chest 2 View  Result Date: 01/22/2021 CLINICAL DATA:  COVID-19 positive EXAM: CHEST - 2 VIEW COMPARISON:  06/22/2017 FINDINGS: Frontal and lateral views of the chest demonstrate an unremarkable cardiac silhouette. No acute airspace disease, effusion, or pneumothorax. No acute bony abnormalities. IMPRESSION: 1. No acute intrathoracic process. Electronically Signed   By: Sharlet Salina M.D.   On: 01/22/2021 19:28   ____________________________________________  PROCEDURES  Tylenol 650 mg p.o.  Procedures ____________________________________________   INITIAL IMPRESSION / ASSESSMENT AND PLAN / ED COURSE  As part of my medical decision making, I reviewed the following data within the electronic MEDICAL RECORD NUMBER Radiograph reviewed WNL and Notes from prior ED visits   DDX: covid, CAP, bronchitis  Patient with confirmed COVID during home test, presents with some fevers cough and congestion.  He is evaluated for complaints in the ED, and found to have a reassuring  chest x-ray without signs of acute infectious process.  No hypoxia or tachycardia on exam.  Patient is reporting improved symptoms after Tylenol administration.  She does have confirmed improvement of her temperature to 99.0 F, and a normotensive BP confirmed by me that was not updated to the chart.  Patient is stable for discharge at this time and will follow with primary provider for ongoing symptoms.  Prescriptions for Paxlovid, Bromfed, Tessalon Perles, and Zofran provided for her benefit.  Sara Reed was evaluated in Emergency Department on 01/23/2021 for the symptoms described in the history of present illness. She was evaluated in the context of the global COVID-19 pandemic, which necessitated consideration that the patient might be at risk for infection with the SARS-CoV-2 virus that causes COVID-19. Institutional protocols and algorithms that pertain to the evaluation of patients at risk for COVID-19 are in a state of rapid change based on information released by regulatory bodies including the CDC and federal and state organizations. These policies and algorithms were followed during the patient's care in the ED. ____________________________________________  FINAL CLINICAL IMPRESSION(S) / ED DIAGNOSES  Final diagnoses:  COVID-19      [name], PA-C 01/23/21 1335    Shaune Pollack, MD 01/25/21 [phone]

## 2021-01-22 NOTE — ED Provider Notes (Signed)
Emergency Medicine Provider Triage Evaluation Note  Sara Reed , a 60 y.o. female  was evaluated in triage.  Pt complains of cough and congestion, with a confirmed home COVID test last night.  Patient denies any significant shortness of breath or chest pain.  Review of Systems  Positive: Cough, fevers Negative: CP, SOB  Physical Exam  BP (!) 187/107 (BP Location: Left Arm)   Pulse 100   Temp (!) 101.1 F (38.4 C) (Oral)   Resp 18   SpO2 99%  Gen:   Awake, no distress  NAD Resp:  Normal effort CTA MSK:   Moves extremities without difficulty  Other:  CVS: RRR  Medical Decision Making  Medically screening exam initiated at 6:41 PM.  Appropriate orders placed.  Sara Reed was informed that the remainder of the evaluation will be completed by another provider, this initial triage assessment does not replace that evaluation, and the importance of remaining in the ED until their evaluation is complete.  Patient with ED evaluation of COVID symptoms.   Sara Hoard, PA-C 01/22/21 1847    Sara Chiquito, MD 01/22/21 [phone]

## 2021-06-14 ENCOUNTER — Encounter: Payer: Self-pay | Admitting: Emergency Medicine

## 2021-06-14 ENCOUNTER — Other Ambulatory Visit: Payer: Self-pay

## 2021-06-14 ENCOUNTER — Emergency Department: Payer: Medicaid Other

## 2021-06-14 ENCOUNTER — Emergency Department
Admission: EM | Admit: 2021-06-14 | Discharge: 2021-06-14 | Disposition: A | Discharge status: Home / Self Care | Payer: Medicaid Other | Attending: Emergency Medicine | Admitting: Emergency Medicine

## 2021-06-14 DIAGNOSIS — Z20822 Contact with and (suspected) exposure to covid-19: Secondary | ICD-10-CM | POA: Insufficient documentation

## 2021-06-14 DIAGNOSIS — R051 Acute cough: Secondary | ICD-10-CM | POA: Insufficient documentation

## 2021-06-14 DIAGNOSIS — I10 Essential (primary) hypertension: Secondary | ICD-10-CM | POA: Insufficient documentation

## 2021-06-14 DIAGNOSIS — J209 Acute bronchitis, unspecified: Secondary | ICD-10-CM | POA: Insufficient documentation

## 2021-06-14 LAB — RESP PANEL BY RT-PCR (FLU A&B, COVID) ARPGX2
Influenza A by PCR: NEGATIVE
Influenza B by PCR: NEGATIVE
SARS Coronavirus 2 by RT PCR: NEGATIVE

## 2021-06-14 NOTE — ED Provider Notes (Signed)
[facility] Provider Note    None    (approximate)   History   Cough   HPI  Sara Reed is a 61 y.o. female who presents to the ED for evaluation of Cough   Hx hypertension on multiple agents.  Patient presents to the ED for evaluation of 2 days of upper respiratory congestion, cough and a "rattling sensation" in her chest.  She reports concern for pneumonia due to this latter sensation.  Denies chest pain, dizziness, syncope, falls or injuries.  Denies abdominal pain, emesis or fever.  No recent antibiotics.  Physical Exam   Triage Vital Signs: ED Triage Vitals  Enc Vitals Group     BP 06/14/21 0200 (!) 207/113     Pulse Rate 06/14/21 0200 100     Resp 06/14/21 0200 18     Temp 06/14/21 0200 99.1 F (37.3 C)     Temp Source 06/14/21 0200 Oral     SpO2 06/14/21 0200 95 %     Weight 06/14/21 0207 216 lb (98 kg)     Height 06/14/21 0207 5\' 6"  (1.676 m)     Head Circumference --      Peak Flow --      Pain Score 06/14/21 0207 0     Pain Loc --      Pain Edu? --      Excl. in [place]? --     Most recent vital signs: Vitals:   06/14/21 0200  BP: (!) 207/113  Pulse: 100  Resp: 18  Temp: 99.1 F (37.3 C)  SpO2: 95%    General: Awake, no distress.  Sitting up on the side of the bed, looks well.  Conversational. CV:  Good peripheral perfusion.  RRR Resp:  Normal effort.  CTAB Abd:  No distention.  Soft and benign throughout MSK:  No deformity noted.  Neuro:  No focal deficits appreciated.  Ambulatory with normal gait Other:     ED Results / Procedures / Treatments   Labs (all labs ordered are listed, but only abnormal results are displayed) Labs Reviewed  RESP PANEL BY RT-PCR (FLU A&B, COVID) [id number]    EKG   RADIOLOGY CXR reviewed by me without evidence of acute cardiopulmonary pathology.  Official radiology report(s): DG Chest 2 View  Result Date: 06/14/2021 CLINICAL DATA:  Parenchyma. EXAM: CHEST - 2 VIEW COMPARISON:   Chest radiograph dated 01/22/2021. FINDINGS: No focal consolidation, pleural effusion, pneumothorax the cardiac silhouette is within normal limits. No acute osseous pathology. Degenerative changes of the spine. IMPRESSION: No active cardiopulmonary disease. Electronically Signed   By: Anner Crete M.D.   On: 06/14/2021 02:32    PROCEDURES and INTERVENTIONS:  Procedures  Medications - No data to display   IMPRESSION / MDM / [place] / ED COURSE  I reviewed the triage vital signs and the nursing notes.  61 year old female presents to the ED with a couple days of a productive cough, likely a viral bronchitis and suitable for outpatient management.  Her chest x-ray is clear without signs of CAP.  She looks clinically well to me.  Not tachycardic on my examination.  She is notably hypertensive, and she reports compliance with her medications.  When reviewing previous ED visits, clinic visits she is noted to be persistently quite hypertensive.  She has no symptoms or signs to suggest any endorgan damage or hypertensive emergency.  I believe she is suitable for outpatient management.  No indications for antibiotics.  We discussed management of bronchitis at home and return precautions for the ED.      FINAL CLINICAL IMPRESSION(S) / ED DIAGNOSES   Final diagnoses:  Acute bronchitis, unspecified organism  Acute cough     Rx / DC Orders   ED Discharge Orders     None        Note:  This document was prepared using Dragon voice recognition software and may include unintentional dictation errors.   Vladimir Crofts, MD 06/14/21 [phone]

## 2021-06-14 NOTE — Discharge Instructions (Signed)
Use Tylenol for pain and fevers.  Up to 1000 mg per dose, up to 4 times per day.  Do not take more than 4000 mg of Tylenol/acetaminophen within 24 hours..  Use naproxen/Aleve for anti-inflammatory pain relief. Use up to 500mg  every 12 hours. Do not take more frequently than this. Do not use other NSAIDs (ibuprofen, Advil) while taking this medication. It is safe to take Tylenol with this.   Use either Sudafed or Mucinex to help thin the mucus to get it up.  If you develop any high fevers, passing out with your symptoms, severe chest pain or inability to breathe, then please return to the ED

## 2021-06-14 NOTE — ED Triage Notes (Signed)
Pt to ED from home c/o productive cough for a couple days, green phlegm, denies fevers.

## 2022-02-01 ENCOUNTER — Emergency Department
Admission: EM | Admit: 2022-02-01 | Discharge: 2022-02-01 | Disposition: A | Discharge status: Home / Self Care | Payer: Medicaid Other | Attending: Emergency Medicine | Admitting: Emergency Medicine

## 2022-02-01 ENCOUNTER — Other Ambulatory Visit: Payer: Self-pay

## 2022-02-01 ENCOUNTER — Emergency Department: Payer: Medicaid Other

## 2022-02-01 DIAGNOSIS — Z20822 Contact with and (suspected) exposure to covid-19: Secondary | ICD-10-CM | POA: Insufficient documentation

## 2022-02-01 DIAGNOSIS — I10 Essential (primary) hypertension: Secondary | ICD-10-CM | POA: Insufficient documentation

## 2022-02-01 DIAGNOSIS — J181 Lobar pneumonia, unspecified organism: Secondary | ICD-10-CM | POA: Insufficient documentation

## 2022-02-01 DIAGNOSIS — J189 Pneumonia, unspecified organism: Secondary | ICD-10-CM

## 2022-02-01 LAB — COMPREHENSIVE METABOLIC PANEL
ALT: 22 U/L (ref 0–44)
AST: 25 U/L (ref 15–41)
Albumin: 4.1 g/dL (ref 3.5–5.0)
Alkaline Phosphatase: 81 U/L (ref 38–126)
Anion gap: 9 (ref 5–15)
BUN: 13 mg/dL (ref 6–20)
CO2: 24 mmol/L (ref 22–32)
Calcium: 9.3 mg/dL (ref 8.9–10.3)
Chloride: 105 mmol/L (ref 98–111)
Creatinine, Ser: 1.04 mg/dL — ABNORMAL HIGH (ref 0.44–1.00)
GFR, Estimated: >60 mL/min (ref >60)
Glucose, Bld: 99 mg/dL (ref 70–99)
Potassium: 3.4 mmol/L — ABNORMAL LOW (ref 3.5–5.1)
Sodium: 138 mmol/L (ref 135–145)
Total Bilirubin: 0.9 mg/dL (ref 0.3–1.2)
Total Protein: 8.5 g/dL — ABNORMAL HIGH (ref 6.5–8.1)

## 2022-02-01 LAB — CBC WITH DIFFERENTIAL/PLATELET
Abs Immature Granulocytes: 0.01 10*3/uL (ref 0.00–0.07)
Basophils Absolute: 0.1 10*3/uL (ref 0.0–0.1)
Basophils Relative: 1 %
Eosinophils Absolute: 0.1 10*3/uL (ref 0.0–0.5)
Eosinophils Relative: 1 %
HCT: 32.9 % — ABNORMAL LOW (ref 36.0–46.0)
Hemoglobin: 10.6 g/dL — ABNORMAL LOW (ref 12.0–15.0)
Immature Granulocytes: 0 %
Lymphocytes Relative: 26 %
Lymphs Abs: 2.4 10*3/uL (ref 0.7–4.0)
MCH: 29 pg (ref 26.0–34.0)
MCHC: 32.2 g/dL (ref 30.0–36.0)
MCV: 90.1 fL (ref 80.0–100.0)
Monocytes Absolute: 0.8 10*3/uL (ref 0.1–1.0)
Monocytes Relative: 9 %
Neutro Abs: 6 10*3/uL (ref 1.7–7.7)
Neutrophils Relative %: 63 %
Platelets: 295 10*3/uL (ref 150–400)
RBC: 3.65 MIL/uL — ABNORMAL LOW (ref 3.87–5.11)
RDW: 15.3 % (ref 11.5–15.5)
WBC: 9.4 10*3/uL (ref 4.0–10.5)
nRBC: 0 % (ref 0.0–0.2)

## 2022-02-01 LAB — SARS CORONAVIRUS 2 BY RT PCR: SARS Coronavirus 2 by RT PCR: NEGATIVE

## 2022-02-01 LAB — TROPONIN I (HIGH SENSITIVITY): Troponin I (High Sensitivity): 31 ng/L — ABNORMAL HIGH (ref <18)

## 2022-02-01 MED ORDER — ALBUTEROL SULFATE (2.5 MG/3ML) 0.083% IN NEBU: 3.0000 mL | INHALATION_SOLUTION | RESPIRATORY_TRACT | Status: DC | PRN

## 2022-02-01 MED ORDER — AMOXICILLIN-POT CLAVULANATE 875-125 MG PO TABS
1.0000 | ORAL_TABLET | Freq: Once | ORAL | Status: AC
  Administered 2022-02-01: 1 via ORAL
  Filled 2022-02-01: qty 1

## 2022-02-01 MED ORDER — AMOXICILLIN-POT CLAVULANATE 875-125 MG PO TABS: 1.0000 | ORAL_TABLET | Freq: Two times a day (BID) | ORAL | 0 refills | Status: AC

## 2022-02-01 NOTE — ED Provider Triage Note (Signed)
  Emergency Medicine Provider Triage Evaluation Note  Sara Reed , a 61 y.o.female,  was evaluated in triage.  Pt complains of shortness of breath started yesterday.  States states that it is exertional in nature.  She states that she was walking felt she could not catch her breath.  Additionally states that her blood pressure has been elevated as well, though she has been compliant with her medications.   Review of Systems  Positive: Shortness of breath, hypertension Negative: Denies fever, chest pain, vomiting  Physical Exam   Vitals:   02/01/22 1611  BP: (!) 219/125  Pulse: (!) 104  Resp: 18  Temp: 99.1 F (37.3 C)  SpO2: 97%   Gen:   Awake, no distress   Resp:  Normal effort  MSK:   Moves extremities without difficulty  Other:    Medical Decision Making  Given the patient's initial medical screening exam, the following diagnostic evaluation has been ordered. The patient will be placed in the appropriate treatment space, once one is available, to complete the evaluation and treatment. I have discussed the plan of care with the patient and I have advised the patient that an ED physician or mid-level practitioner will reevaluate their condition after the test results have been received, as the results may give them additional insight into the type of treatment they may need.    Diagnostics: Labs, CXR, EKG, COVID test  Treatments: none immediately   Teodoro Spray, [place] 02/01/22 1646

## 2022-02-01 NOTE — ED Triage Notes (Addendum)
Pt reports new onset of exertional SOB that started yesterday. She states she was walking and felt like she couldn't catch her breath. Covid test on Tuesday was negative. She denies CP or any extremity swelling. Denies any pain. Pt speaking clear complete sentences, skin warm and dry.  BP in triage 219/125, She states she is compliant with her BP meds and took them today.

## 2022-02-01 NOTE — ED Provider Notes (Signed)
[facility] Provider Note    Event Date/Time   First MD Initiated Contact with Patient 02/01/22 1841     (approximate)   History   Chief Complaint: Shortness of Breath   HPI  Sara Reed is a 61 y.o. female with a past history of high blood pressure who comes ED complaining of shortness of breath that is worse with walking, associated with productive cough.  She has had worsening respiratory symptoms over the past 5 days, initially starting with rhinorrhea and congestion, then sore throat, and now shortness of breath and cough.  She went to her primary care doctor who did a COVID test which was negative.  Her evaluation at that time was reassuring but she is continue to feel worse over the last few days.  She also notes that she has been compliant with her blood pressure medicine.  Blood pressure was pretty normal when she was at primary care, and she checks it every day and finds that it has remained stable and pretty normal, approximately 140/80.  She does feel nervous about being here in the emergency department today.     Physical Exam   Triage Vital Signs: ED Triage Vitals  Enc Vitals Group     BP 02/01/22 1611 (!) 219/125     Pulse Rate 02/01/22 1611 (!) 104     Resp 02/01/22 1611 18     Temp 02/01/22 1611 99.1 F (37.3 C)     Temp Source 02/01/22 1611 Oral     SpO2 02/01/22 1611 97 %     Weight 02/01/22 1615 216 lb (98 kg)     Height 02/01/22 1615 5\' 6"  (1.676 m)     Head Circumference --      Peak Flow --      Pain Score 02/01/22 1615 0     Pain Loc --      Pain Edu? --      Excl. in [place]? --     Most recent vital signs: Vitals:   02/01/22 1845 02/01/22 1854  BP:  (!) 208/125  Pulse: 95 98  Resp: 18 (!) 23  Temp:  99.3 F (37.4 C)  SpO2: 100% 100%    General: Awake, no distress.  CV:  Good peripheral perfusion.  Regular rate and rhythm, heart rate of 90 Resp:  Normal effort.  Bilateral lower lung crackles at the bases.   Symmetric air movement, normal expiratory phase.  Ambulatory pulse oxygenation is 100% on room air Abd:  No distention.  Soft nontender Other:  Moist oral mucosa.  No lower extremity edema or calf tenderness.  Ambulatory with steady gait.   ED Results / Procedures / Treatments   Labs (all labs ordered are listed, but only abnormal results are displayed) Labs Reviewed  CBC WITH DIFFERENTIAL/PLATELET - Abnormal; Notable for the following components:      Result Value   RBC 3.65 (*)    Hemoglobin 10.6 (*)    HCT 32.9 (*)    All other components within normal limits  COMPREHENSIVE METABOLIC PANEL - Abnormal; Notable for the following components:   Potassium 3.4 (*)    Creatinine, Ser 1.04 (*)    Total Protein 8.5 (*)    All other components within normal limits  TROPONIN I (HIGH SENSITIVITY) - Abnormal; Notable for the following components:   Troponin I (High Sensitivity) 31 (*)    All other components within normal limits  SARS CORONAVIRUS 2 BY RT PCR  TROPONIN I (HIGH SENSITIVITY)     EKG Interpreted by me Sinus tachycardia rate 102.  Right axis, left bundle branch block.  No acute ischemic changes.   RADIOLOGY Chest x-ray interpreted by me, shows right basilar infiltrate consistent with pneumonia.  Radiology report reviewed.   PROCEDURES:  Procedures   MEDICATIONS ORDERED IN ED: Medications  albuterol (PROVENTIL) (2.5 MG/3ML) 0.083% nebulizer solution 3 mL (has no administration in time range)  amoxicillin-clavulanate (AUGMENTIN) 875-125 MG per tablet 1 tablet (1 tablet Oral Given 02/01/22 1942)     IMPRESSION / MDM / ASSESSMENT AND PLAN / ED COURSE  I reviewed the triage vital signs and the nursing notes.                              Differential diagnosis includes, but is not limited to, acute bronchitis, COVID, other viral illness, bacterial pneumonia, pleural effusion.  Doubt ACS PE dissection or pericardial effusion.  Patient's presentation is most  consistent with acute presentation with potential threat to life or bodily function.  Patient presents with progressive respiratory symptoms including cough and shortness of breath that is worse with exertion.  No chest pain, no pleuritic symptoms.  Oxygenation remains normal with ambulation.  COVID test is negative and serum labs are reassuring.  Troponin was ordered as part of triage protocol but cardiac work-up is not indicated.  Blood pressure is markedly elevated in the ED, however over the last several days with routine blood pressure checks the patient notes that it has been near normal on her usual blood pressure medicine.  I suspect her markedly elevated blood pressure in the ED is a form of whitecoat syndrome, and increasing antihypertensives risks causing hypotension at home.  She will continue to monitor her blood pressure and follow-up with PCP about it.  She is comfortable with outpatient management of pneumonia seen on chest x-ray.  She is not septic or hypoxic.  I think she is suitable for oral antibiotic therapy.       FINAL CLINICAL IMPRESSION(S) / ED DIAGNOSES   Final diagnoses:  Community acquired pneumonia of right lower lobe of lung  Hypertension, unspecified type     Rx / DC Orders   ED Discharge Orders          Ordered    amoxicillin-clavulanate (AUGMENTIN) 875-125 MG tablet  2 times daily        02/01/22 1928             Note:  This document was prepared using Dragon voice recognition software and may include unintentional dictation errors.   Carrie Mew, MD 02/01/22 2014

## 2022-02-10 ENCOUNTER — Emergency Department
Admission: EM | Admit: 2022-02-10 | Discharge: 2022-02-10 | Disposition: A | Discharge status: Home / Self Care | Payer: Medicaid Other | Attending: Emergency Medicine | Admitting: Emergency Medicine

## 2022-02-10 ENCOUNTER — Emergency Department: Payer: Medicaid Other

## 2022-02-10 DIAGNOSIS — J40 Bronchitis, not specified as acute or chronic: Secondary | ICD-10-CM | POA: Insufficient documentation

## 2022-02-10 DIAGNOSIS — I10 Essential (primary) hypertension: Secondary | ICD-10-CM | POA: Insufficient documentation

## 2022-02-10 LAB — CBC WITH DIFFERENTIAL/PLATELET
Abs Immature Granulocytes: 0.02 10*3/uL (ref 0.00–0.07)
Basophils Absolute: 0.1 10*3/uL (ref 0.0–0.1)
Basophils Relative: 1 %
Eosinophils Absolute: 0.1 10*3/uL (ref 0.0–0.5)
Eosinophils Relative: 1 %
HCT: 33.5 % — ABNORMAL LOW (ref 36.0–46.0)
Hemoglobin: 10.5 g/dL — ABNORMAL LOW (ref 12.0–15.0)
Immature Granulocytes: 0 %
Lymphocytes Relative: 40 %
Lymphs Abs: 3.1 10*3/uL (ref 0.7–4.0)
MCH: 28.7 pg (ref 26.0–34.0)
MCHC: 31.3 g/dL (ref 30.0–36.0)
MCV: 91.5 fL (ref 80.0–100.0)
Monocytes Absolute: 0.6 10*3/uL (ref 0.1–1.0)
Monocytes Relative: 8 %
Neutro Abs: 3.8 10*3/uL (ref 1.7–7.7)
Neutrophils Relative %: 50 %
Platelets: 362 10*3/uL (ref 150–400)
RBC: 3.66 MIL/uL — ABNORMAL LOW (ref 3.87–5.11)
RDW: 15.4 % (ref 11.5–15.5)
WBC: 7.7 10*3/uL (ref 4.0–10.5)
nRBC: 0 % (ref 0.0–0.2)

## 2022-02-10 LAB — BASIC METABOLIC PANEL
Anion gap: 6 (ref 5–15)
BUN: 18 mg/dL (ref 6–20)
CO2: 21 mmol/L — ABNORMAL LOW (ref 22–32)
Calcium: 8.9 mg/dL (ref 8.9–10.3)
Chloride: 111 mmol/L (ref 98–111)
Creatinine, Ser: 1.03 mg/dL — ABNORMAL HIGH (ref 0.44–1.00)
GFR, Estimated: >60 mL/min (ref >60)
Glucose, Bld: 130 mg/dL — ABNORMAL HIGH (ref 70–99)
Potassium: 3.5 mmol/L (ref 3.5–5.1)
Sodium: 138 mmol/L (ref 135–145)

## 2022-02-10 MED ORDER — DEXAMETHASONE SODIUM PHOSPHATE 10 MG/ML IJ SOLN
10.0000 mg | Freq: Once | INTRAMUSCULAR | Status: AC
  Administered 2022-02-10: 10 mg via INTRAVENOUS
  Filled 2022-02-10: qty 1

## 2022-02-10 MED ORDER — KETOROLAC TROMETHAMINE 15 MG/ML IJ SOLN
15.0000 mg | Freq: Once | INTRAMUSCULAR | Status: AC
  Administered 2022-02-10: 15 mg via INTRAVENOUS
  Filled 2022-02-10: qty 1

## 2022-02-10 MED ORDER — NAPROXEN 500 MG PO TABS: 500.0000 mg | ORAL_TABLET | Freq: Two times a day (BID) | ORAL | 0 refills | Status: DC

## 2022-02-10 NOTE — ED Provider Notes (Signed)
[facility] Provider Note    Event Date/Time   First MD Initiated Contact with Patient 02/10/22 0145     (approximate)   History   Chief Complaint: cough  HPI  Sara Reed is a 61 y.o. female with a history of hypertension who was seen in the ED 1 week ago for shortness of breath and diagnosed with pneumonia, treated with a week of Augmentin who comes back to the ED tonight reporting persistent cough which is productive of small amounts of phlegm and clear sputum.  No fever or chest pain.  No dyspnea on exertion.  No vomiting or diarrhea.     Physical Exam   Triage Vital Signs: ED Triage Vitals  Enc Vitals Group     BP      Pulse      Resp      Temp      Temp src      SpO2      Weight      Height      Head Circumference      Peak Flow      Pain Score      Pain Loc      Pain Edu?      Excl. in [place]?     Most recent vital signs: Vitals:   02/10/22 0155 02/10/22 0320  BP: (!) 176/68   Pulse:    Resp:    Temp:  98.2 F (36.8 C)  SpO2:      General: Awake, no distress.  CV:  Good peripheral perfusion.  Regular rate and rhythm, rate of 90 Resp:  Normal effort.  Diminished breath sounds at the right base Abd:  No distention.  Soft nontender Other:  No lower extremity edema or calf tenderness.  Ambulatory with steady gait   ED Results / Procedures / Treatments   Labs (all labs ordered are listed, but only abnormal results are displayed) Labs Reviewed  BASIC METABOLIC PANEL - Abnormal; Notable for the following components:      Result Value   CO2 21 (*)    Glucose, Bld 130 (*)    Creatinine, Ser 1.03 (*)    All other components within normal limits  CBC WITH DIFFERENTIAL/PLATELET - Abnormal; Notable for the following components:   RBC 3.66 (*)    Hemoglobin 10.5 (*)    HCT 33.5 (*)    All other components within normal limits     EKG    RADIOLOGY Chest x-ray interpreted by me, shows persistent infiltrate in the right  lower lung similar to chest x-ray on February 01, 2022.   PROCEDURES:  Procedures   MEDICATIONS ORDERED IN ED: Medications  ketorolac (TORADOL) 15 MG/ML injection 15 mg (15 mg Intravenous Given 02/10/22 0226)  dexamethasone (DECADRON) injection 10 mg (10 mg Intravenous Given 02/10/22 0225)     IMPRESSION / MDM / ASSESSMENT AND PLAN / ED COURSE  I reviewed the triage vital signs and the nursing notes.                              Differential diagnosis includes, but is not limited to, pneumonia, pleural effusion, bronchitis, AKI, electrolyte abnormality  Patient's presentation is most consistent with acute presentation with potential threat to life or bodily function.  Patient presents with frequent cough and some persistent shortness of breath after treatment for pneumonia over the past week.  Vital signs are unremarkable.  She does seem to have a lot of airway inflammation, was given Toradol and Decadron with resolution of her symptoms.  She reports that her shortness of breath is completely gone, and she is having minimal cough at this time.  Ambulatory oxygen saturation remains at 96% on room air.  I reviewed her chest x-ray which shows persistence of the infiltrate from a week ago, which is not surprising with residual congestion and inflammation.  I do not think this represents a treatment failure.  White blood cell count is normal, afebrile, not septic.  She can continue NSAIDs at home, incentive spirometer.       FINAL CLINICAL IMPRESSION(S) / ED DIAGNOSES   Final diagnoses:  Bronchitis     Rx / DC Orders   ED Discharge Orders          Ordered    naproxen (NAPROSYN) 500 MG tablet  2 times daily with meals        02/10/22 0459             Note:  This document was prepared using Dragon voice recognition software and may include unintentional dictation errors.   Sharman Cheek, MD 02/10/22 [phone]

## 2022-02-10 NOTE — ED Triage Notes (Signed)
No report received from EMS upon pt arrival to room. Pt c/o sob. Being tx for pna x 1 week.

## 2022-02-13 ENCOUNTER — Inpatient Hospital Stay
Admit: 2022-02-13 | Discharge: 2022-02-13 | Disposition: A | Discharge status: Home / Self Care | Payer: Medicaid Other | Attending: Internal Medicine | Admitting: Internal Medicine

## 2022-02-13 ENCOUNTER — Encounter: Payer: Self-pay | Admitting: Emergency Medicine

## 2022-02-13 ENCOUNTER — Inpatient Hospital Stay
Admission: EM | Admit: 2022-02-13 | Discharge: 2022-02-15 | DRG: 291 | Disposition: A | Discharge status: Home / Self Care | Payer: Self-pay | Attending: Internal Medicine | Admitting: Internal Medicine

## 2022-02-13 ENCOUNTER — Emergency Department: Payer: Self-pay

## 2022-02-13 ENCOUNTER — Other Ambulatory Visit: Payer: Self-pay

## 2022-02-13 DIAGNOSIS — I2489 Other forms of acute ischemic heart disease: Secondary | ICD-10-CM | POA: Diagnosis present

## 2022-02-13 DIAGNOSIS — I110 Hypertensive heart disease with heart failure: Principal | ICD-10-CM | POA: Diagnosis present

## 2022-02-13 DIAGNOSIS — I10 Essential (primary) hypertension: Secondary | ICD-10-CM | POA: Diagnosis present

## 2022-02-13 DIAGNOSIS — R7303 Prediabetes: Secondary | ICD-10-CM | POA: Diagnosis present

## 2022-02-13 DIAGNOSIS — Z79899 Other long term (current) drug therapy: Secondary | ICD-10-CM

## 2022-02-13 DIAGNOSIS — I5041 Acute combined systolic (congestive) and diastolic (congestive) heart failure: Secondary | ICD-10-CM | POA: Diagnosis present

## 2022-02-13 DIAGNOSIS — J40 Bronchitis, not specified as acute or chronic: Secondary | ICD-10-CM | POA: Diagnosis present

## 2022-02-13 DIAGNOSIS — E669 Obesity, unspecified: Secondary | ICD-10-CM | POA: Diagnosis present

## 2022-02-13 DIAGNOSIS — I509 Heart failure, unspecified: Secondary | ICD-10-CM

## 2022-02-13 DIAGNOSIS — I5A Non-ischemic myocardial injury (non-traumatic): Secondary | ICD-10-CM | POA: Diagnosis present

## 2022-02-13 DIAGNOSIS — Z6835 Body mass index (BMI) 35.0-35.9, adult: Secondary | ICD-10-CM

## 2022-02-13 DIAGNOSIS — D649 Anemia, unspecified: Secondary | ICD-10-CM | POA: Diagnosis present

## 2022-02-13 DIAGNOSIS — N179 Acute kidney failure, unspecified: Secondary | ICD-10-CM | POA: Diagnosis present

## 2022-02-13 DIAGNOSIS — Z7982 Long term (current) use of aspirin: Secondary | ICD-10-CM

## 2022-02-13 DIAGNOSIS — I160 Hypertensive urgency: Secondary | ICD-10-CM | POA: Diagnosis present

## 2022-02-13 LAB — BASIC METABOLIC PANEL
Anion gap: 3 — ABNORMAL LOW (ref 5–15)
BUN: 18 mg/dL (ref 6–20)
CO2: 25 mmol/L (ref 22–32)
Calcium: 9 mg/dL (ref 8.9–10.3)
Chloride: 109 mmol/L (ref 98–111)
Creatinine, Ser: 1.05 mg/dL — ABNORMAL HIGH (ref 0.44–1.00)
GFR, Estimated: >60 mL/min (ref >60)
Glucose, Bld: 101 mg/dL — ABNORMAL HIGH (ref 70–99)
Potassium: 3.5 mmol/L (ref 3.5–5.1)
Sodium: 137 mmol/L (ref 135–145)

## 2022-02-13 LAB — CBC WITH DIFFERENTIAL/PLATELET
Abs Immature Granulocytes: 0.02 10*3/uL (ref 0.00–0.07)
Basophils Absolute: 0.1 10*3/uL (ref 0.0–0.1)
Basophils Relative: 1 %
Eosinophils Absolute: 0.2 10*3/uL (ref 0.0–0.5)
Eosinophils Relative: 2 %
HCT: 34 % — ABNORMAL LOW (ref 36.0–46.0)
Hemoglobin: 10.6 g/dL — ABNORMAL LOW (ref 12.0–15.0)
Immature Granulocytes: 0 %
Lymphocytes Relative: 39 %
Lymphs Abs: 2.6 10*3/uL (ref 0.7–4.0)
MCH: 28.6 pg (ref 26.0–34.0)
MCHC: 31.2 g/dL (ref 30.0–36.0)
MCV: 91.9 fL (ref 80.0–100.0)
Monocytes Absolute: 0.6 10*3/uL (ref 0.1–1.0)
Monocytes Relative: 9 %
Neutro Abs: 3.1 10*3/uL (ref 1.7–7.7)
Neutrophils Relative %: 49 %
Platelets: 371 10*3/uL (ref 150–400)
RBC: 3.7 MIL/uL — ABNORMAL LOW (ref 3.87–5.11)
RDW: 15.7 % — ABNORMAL HIGH (ref 11.5–15.5)
WBC: 6.5 10*3/uL (ref 4.0–10.5)
nRBC: 0 % (ref 0.0–0.2)

## 2022-02-13 LAB — URINE DRUG SCREEN, QUALITATIVE (ARMC ONLY)
Amphetamines, Ur Screen: NOT DETECTED
Barbiturates, Ur Screen: NOT DETECTED
Benzodiazepine, Ur Scrn: NOT DETECTED
Cannabinoid 50 Ng, Ur ~~LOC~~: NOT DETECTED
Cocaine Metabolite,Ur ~~LOC~~: NOT DETECTED
MDMA (Ecstasy)Ur Screen: NOT DETECTED
Methadone Scn, Ur: NOT DETECTED
Opiate, Ur Screen: NOT DETECTED
Phencyclidine (PCP) Ur S: NOT DETECTED
Tricyclic, Ur Screen: NOT DETECTED

## 2022-02-13 LAB — ECHOCARDIOGRAM COMPLETE
AR max vel: 3.24 cm2
AV Area VTI: 3.13 cm2
AV Area mean vel: 2.86 cm2
AV Mean grad: 10 mmHg
AV Peak grad: 15.7 mmHg
Ao pk vel: 1.98 m/s
Area-P 1/2: 7.16 cm2
Calc EF: 44.4 %
Height: 66 in
S' Lateral: 3.2 cm
Single Plane A2C EF: 62.1 %
Single Plane A4C EF: 30.5 %
Weight: 3488 oz

## 2022-02-13 LAB — LIPID PANEL
Cholesterol: 240 mg/dL — ABNORMAL HIGH (ref 0–200)
HDL: 62 mg/dL (ref >40)
LDL Cholesterol: 161 mg/dL — ABNORMAL HIGH (ref 0–99)
Total CHOL/HDL Ratio: 3.9 RATIO
Triglycerides: 86 mg/dL (ref <150)
VLDL: 17 mg/dL (ref 0–40)

## 2022-02-13 LAB — TROPONIN I (HIGH SENSITIVITY)
Troponin I (High Sensitivity): 20 ng/L — ABNORMAL HIGH (ref <18)
Troponin I (High Sensitivity): 18 ng/L — ABNORMAL HIGH (ref <18)

## 2022-02-13 LAB — BRAIN NATRIURETIC PEPTIDE: B Natriuretic Peptide: 189.4 pg/mL — ABNORMAL HIGH (ref 0.0–100.0)

## 2022-02-13 MED ORDER — HYDRALAZINE HCL 20 MG/ML IJ SOLN: 20.0000 mg | INTRAMUSCULAR | Status: DC | PRN

## 2022-02-13 MED ORDER — ALBUTEROL SULFATE (2.5 MG/3ML) 0.083% IN NEBU: 2.5000 mg | INHALATION_SOLUTION | RESPIRATORY_TRACT | Status: DC | PRN

## 2022-02-13 MED ORDER — ALBUTEROL SULFATE (2.5 MG/3ML) 0.083% IN NEBU
5.0000 mg | INHALATION_SOLUTION | Freq: Once | RESPIRATORY_TRACT | Status: AC
  Administered 2022-02-13: 5 mg via RESPIRATORY_TRACT
  Filled 2022-02-13: qty 6

## 2022-02-13 MED ORDER — FUROSEMIDE 10 MG/ML IJ SOLN
40.0000 mg | Freq: Once | INTRAMUSCULAR | Status: AC
  Administered 2022-02-13: 40 mg via INTRAVENOUS
  Filled 2022-02-13: qty 4

## 2022-02-13 MED ORDER — HYDRALAZINE HCL 20 MG/ML IJ SOLN: 10.0000 mg | INTRAMUSCULAR | Status: DC | PRN

## 2022-02-13 MED ORDER — DM-GUAIFENESIN ER 30-600 MG PO TB12: 1.0000 | ORAL_TABLET | Freq: Two times a day (BID) | ORAL | Status: DC | PRN

## 2022-02-13 MED ORDER — IOHEXOL 350 MG/ML SOLN
75.0000 mL | Freq: Once | INTRAVENOUS | Status: AC | PRN
  Administered 2022-02-13: 75 mL via INTRAVENOUS

## 2022-02-13 MED ORDER — LOSARTAN POTASSIUM 50 MG PO TABS
100.0000 mg | ORAL_TABLET | Freq: Every day | ORAL | Status: DC
  Administered 2022-02-13 – 2022-02-15 (×3): 100 mg via ORAL
  Filled 2022-02-13 (×3): qty 2

## 2022-02-13 MED ORDER — ACETAMINOPHEN 325 MG PO TABS: 650.0000 mg | ORAL_TABLET | Freq: Four times a day (QID) | ORAL | Status: DC | PRN

## 2022-02-13 MED ORDER — ENOXAPARIN SODIUM 60 MG/0.6ML IJ SOSY
0.5000 mg/kg | PREFILLED_SYRINGE | INTRAMUSCULAR | Status: DC
  Administered 2022-02-13 – 2022-02-14 (×2): 50 mg via SUBCUTANEOUS
  Filled 2022-02-13 (×2): qty 0.6

## 2022-02-13 MED ORDER — HYDRALAZINE HCL 20 MG/ML IJ SOLN
10.0000 mg | INTRAMUSCULAR | Status: DC | PRN
  Administered 2022-02-13: 10 mg via INTRAVENOUS
  Filled 2022-02-13: qty 1

## 2022-02-13 MED ORDER — METHYLPREDNISOLONE SODIUM SUCC 125 MG IJ SOLR
125.0000 mg | INTRAMUSCULAR | Status: AC
  Administered 2022-02-13: 125 mg via INTRAVENOUS
  Filled 2022-02-13: qty 2

## 2022-02-13 MED ORDER — ONDANSETRON HCL 4 MG/2ML IJ SOLN: 4.0000 mg | Freq: Three times a day (TID) | INTRAMUSCULAR | Status: DC | PRN

## 2022-02-13 MED ORDER — LACTATED RINGERS IV BOLUS
1000.0000 mL | Freq: Once | INTRAVENOUS | Status: AC
  Administered 2022-02-13: 1000 mL via INTRAVENOUS

## 2022-02-13 MED ORDER — ASPIRIN 81 MG PO TBEC
81.0000 mg | DELAYED_RELEASE_TABLET | Freq: Every day | ORAL | Status: DC
  Administered 2022-02-13 – 2022-02-15 (×3): 81 mg via ORAL
  Filled 2022-02-13 (×3): qty 1

## 2022-02-13 MED ORDER — FUROSEMIDE 10 MG/ML IJ SOLN
40.0000 mg | Freq: Two times a day (BID) | INTRAMUSCULAR | Status: DC
  Administered 2022-02-13 – 2022-02-15 (×4): 40 mg via INTRAVENOUS
  Filled 2022-02-13 (×4): qty 4

## 2022-02-13 MED ORDER — METOPROLOL TARTRATE 25 MG PO TABS
50.0000 mg | ORAL_TABLET | Freq: Two times a day (BID) | ORAL | Status: DC
  Administered 2022-02-13 – 2022-02-14 (×3): 50 mg via ORAL
  Filled 2022-02-13: qty 1
  Filled 2022-02-13 (×2): qty 2

## 2022-02-13 MED ORDER — IPRATROPIUM-ALBUTEROL 0.5-2.5 (3) MG/3ML IN SOLN
3.0000 mL | Freq: Once | RESPIRATORY_TRACT | Status: AC
  Administered 2022-02-13: 3 mL via RESPIRATORY_TRACT
  Filled 2022-02-13: qty 3

## 2022-02-13 MED ORDER — SPIRONOLACTONE 25 MG PO TABS
25.0000 mg | ORAL_TABLET | Freq: Every day | ORAL | Status: DC
  Administered 2022-02-13 – 2022-02-15 (×3): 25 mg via ORAL
  Filled 2022-02-13 (×3): qty 1

## 2022-02-13 NOTE — ED Triage Notes (Signed)
Pt via POV from home. Pt has been seen on 10/6 and 10/15 for the same and has been discharged. States that she her respiratory symptoms have been getting worse the past 2 days. Denies any hx of respiratory illnesses. Pt is A&Ox4 and NAD.

## 2022-02-13 NOTE — H&P (Signed)
History and Physical    [name] [id number] DOB: 22-Dec-1960 DOA: 02/13/2022  Referring MD/NP/PA:   PCP: Denton Lank, MD   Patient coming from:  The patient is coming from home.  At baseline, pt is independent for most of ADL.        Chief Complaint: SOB  HPI: Sara Reed is a 61 y.o. female with medical history significant of HTN, who presents with SOB  Patient has SOB for about 3 weeks.  Patient was seen in ED on 10/6 and 10/15. She was treated as possible bronchitis with a course of Augmentin and was given Decadron.  Patient continued to have shortness breath. She coughs up pink-tinged mucus.  Denies chest pain, fever or chills.  No nausea, vomiting, diarrhea or abdominal pain.  No symptoms of UTI.  Patient has trace bilateral leg edema.  Data reviewed independently and ED Course: pt was found to have troponin level 20, BNP 189, WBC 6.5, GFR> 60, temperature 97.3, blood pressure 192/114, heart rate 108, RR 17, oxygen saturation 92-94% on room air.  CTA is negative for PE, but showed moderate pulmonary edema.  Patient is admitted to telemetry bed as inpatient.  Dr. Clayborn Bigness of cardiology is consulted.   EKG: I have personally reviewed.  Sinus rhythm, low voltage, poor R wave progression, LAD, LAD   Review of Systems:   General: no fevers, chills, no body weight gain, has fatigue HEENT: no blurry vision, hearing changes or sore throat Respiratory: has dyspnea, coughing, no wheezing CV: no chest pain, no palpitations GI: no nausea, vomiting, abdominal pain, diarrhea, constipation GU: no dysuria, burning on urination, increased urinary frequency, hematuria  Ext: has leg edema Neuro: no unilateral weakness, numbness, or tingling, no vision change or hearing loss Skin: no rash, no skin tear. MSK: No muscle spasm, no deformity, no limitation of range of movement in spin Heme: No easy bruising.  Travel history: No recent long distant travel.   Allergy: No Known  Allergies  Past Medical History:  Diagnosis Date   Hypertension     Past Surgical History:  Procedure Laterality Date   ORIF ANKLE FRACTURE Right 06/04/2019   Procedure: OPEN REDUCTION INTERNAL FIXATION WITH DISTAL FIBULA PLATE AND SYNDESMOTIC REPAIR OF RIGHT ANKLE.;  Surgeon: Dereck Leep, MD;  Location: [facility] ORS;  Service: Orthopedics;  Laterality: Right;    Social History:  reports that she has never smoked. She has never used smokeless tobacco. She reports that she does not drink alcohol and does not use drugs.  Family History:  Family History  Problem Relation Age of Onset   Breast cancer Neg Hx      Prior to Admission medications   Medication Sig Start Date End Date Taking? Authorizing Provider  amLODipine (NORVASC) 5 MG tablet Take 1 tablet (5 mg total) by mouth daily. Patient not taking: Reported on 05/31/2019 06/24/17 05/31/19  Gladstone Lighter, MD  aspirin EC 81 MG tablet Take 81 mg by mouth daily.    [provider]  benzonatate (TESSALON PERLES) 100 MG capsule Take 1-2 tabs TID prn cough 01/22/21   Menshew, Dannielle Karvonen, PA-C  brompheniramine-pseudoephedrine-DM 30-2-10 MG/5ML syrup Take 5 mLs by mouth 4 (four) times daily as needed. 01/22/21   Menshew, Dannielle Karvonen, PA-C  lisinopril-hydrochlorothiazide (PRINZIDE,ZESTORETIC) 20-25 MG tablet Take 1 tablet by mouth daily.    [provider]  naproxen (NAPROSYN) 500 MG tablet Take 1 tablet (500 mg total) by mouth 2 (two) times daily with a  meal. 02/10/22   Carrie Mew, MD  ondansetron (ZOFRAN ODT) 4 MG disintegrating tablet Take 1 tablet (4 mg total) by mouth every 8 (eight) hours as needed. 01/22/21   Menshew, Dannielle Karvonen, PA-C  oxyCODONE (ROXICODONE) 5 MG immediate release tablet Take 1 tablet (5 mg total) by mouth every 4 (four) hours as needed for severe pain. 06/04/19   Hooten, Laurice Record, MD  oxyCODONE-acetaminophen (PERCOCET/ROXICET) 5-325 MG tablet Take 1 tablet by mouth every 4 (four) hours as  needed for severe pain.     [provider]    Physical Exam: Vitals:   02/13/22 1144 02/13/22 1145 02/13/22 1200 02/13/22 1255  BP:  (!) 201/117 (!) 189/114 (!) 190/108  Pulse:  100 (!) 104 (!) 115  Resp:  18 16   Temp: 98.3 F (36.8 C)     TempSrc: Oral     SpO2:  92% 94%   Weight:      Height:       General: Not in acute distress HEENT:       Eyes: PERRL, EOMI, no scleral icterus.       ENT: No discharge from the ears and nose, no pharynx injection, no tonsillar enlargement.        Neck: Has positive hepatojugular reflux, no bruit, no mass felt. Heme: No neck lymph node enlargement. Cardiac: S1/S2, RRR, No murmurs, No gallops or rubs. Respiratory: Has decreased air movement bilaterally, has fine crackles at bases bilaterally GI: Soft, nondistended, nontender, no rebound pain, no organomegaly, BS present. GU: No hematuria Ext: has trace leg edema bilaterally. 1+DP/PT pulse bilaterally. Musculoskeletal: No joint deformities, No joint redness or warmth, no limitation of ROM in spin. Skin: No rashes.  Neuro: Alert, oriented X3, cranial nerves II-XII grossly intact, moves all extremities normally.  Psych: Patient is not psychotic, no suicidal or hemocidal ideation.  Labs on Admission: I have personally reviewed following labs and imaging studies  CBC: Recent Labs  Lab 02/10/22 0149 02/13/22 0744  WBC 7.7 6.5  NEUTROABS 3.8 3.1  HGB 10.5* 10.6*  HCT 33.5* 34.0*  MCV 91.5 91.9  PLT 362 [id number]   Basic Metabolic Panel: Recent Labs  Lab 02/10/22 0149 02/13/22 0744  NA 138 137  K 3.5 3.5  CL 111 109  CO2 21* 25  GLUCOSE 130* 101*  BUN 18 18  CREATININE 1.03* 1.05*  CALCIUM 8.9 9.0   GFR: Estimated Creatinine Clearance: 68.4 mL/min (A) (by C-G formula based on SCr of 1.05 mg/dL (H)). Liver Function Tests: No results for input(s): "AST", "ALT", "ALKPHOS", "BILITOT", "PROT", "ALBUMIN" in the last 168 hours. No results for input(s): "LIPASE", "AMYLASE" in the  last 168 hours. No results for input(s): "AMMONIA" in the last 168 hours. Coagulation Profile: No results for input(s): "INR", "PROTIME" in the last 168 hours. Cardiac Enzymes: No results for input(s): "CKTOTAL", "CKMB", "CKMBINDEX", "TROPONINI" in the last 168 hours. BNP (last 3 results) No results for input(s): "PROBNP" in the last 8760 hours. HbA1C: No results for input(s): "HGBA1C" in the last 72 hours. CBG: No results for input(s): "GLUCAP" in the last 168 hours. Lipid Profile: Recent Labs    02/13/22 1031  CHOL 240*  HDL 62  LDLCALC 161*  TRIG 86  CHOLHDL 3.9   Thyroid Function Tests: No results for input(s): "TSH", "T4TOTAL", "FREET4", "T3FREE", "THYROIDAB" in the last 72 hours. Anemia Panel: No results for input(s): "VITAMINB12", "FOLATE", "FERRITIN", "TIBC", "IRON", "RETICCTPCT" in the last 72 hours. Urine analysis:    Component  Value Date/Time   COLORURINE COLORLESS (A) 06/22/2017 1117   APPEARANCEUR CLEAR (A) 06/22/2017 1117   APPEARANCEUR Clear 11/10/2013 1947   LABSPEC 1.025 06/22/2017 1117   LABSPEC 1.015 11/10/2013 1947   PHURINE 7.0 06/22/2017 1117   GLUCOSEU NEGATIVE 06/22/2017 1117   GLUCOSEU Negative 11/10/2013 1947   HGBUR NEGATIVE 06/22/2017 1117   BILIRUBINUR NEGATIVE 06/22/2017 1117   BILIRUBINUR Negative 11/10/2013 [place] NEGATIVE 06/22/2017 [place] 06/22/2017 1117   NITRITE NEGATIVE 06/22/2017 1117   LEUKOCYTESUR NEGATIVE 06/22/2017 1117   LEUKOCYTESUR Negative 11/10/2013 1947   Sepsis Labs: @LABRCNTIP (procalcitonin:4,lacticidven:4) )No results found for this or any previous visit (from the past 240 hour(s)).   Radiological Exams on Admission: CT Angio Chest PE W and/or Wo Contrast  Result Date: 02/13/2022 CLINICAL DATA:  Shortness of breath EXAM: CT ANGIOGRAPHY CHEST WITH CONTRAST TECHNIQUE: Multidetector CT imaging of the chest was performed using the standard protocol during bolus administration of  intravenous contrast. Multiplanar CT image reconstructions and MIPs were obtained to evaluate the vascular anatomy. RADIATION DOSE REDUCTION: This exam was performed according to the departmental dose-optimization program which includes automated exposure control, adjustment of the mA and/or kV according to patient size and/or use of iterative reconstruction technique. CONTRAST:  93mL OMNIPAQUE IOHEXOL 350 MG/ML SOLN COMPARISON:  CT chest angio dated June 22, 2017 FINDINGS: Cardiovascular: No evidence of pulmonary embolus. Heart is upper limits of normal in size. No pericardial effusion. Normal caliber thoracic aorta with mild atherosclerotic disease. Visible coronary artery calcifications. Mediastinum/Nodes: Small hiatal hernia. Thyroid is unremarkable. Calcified mediastinal and right hilar lymph nodes, likely sequela of prior granulomatous infection. Mildly enlarged mediastinal lymph nodes, likely reactive, reference subcarinal lymph node measuring 1.4 cm in short axis on series 13, image 47. Lungs/Pleura: Central airways are patent. Bronchovascular bundle thickening and smooth interlobular septal thickening which is most pronounced in the lower lungs. Mild diffuse ground-glass opacities. Bibasilar atelectasis and trace bilateral pleural effusions. Upper Abdomen: No acute abnormality. Musculoskeletal: No chest wall abnormality. No acute or significant osseous findings. Review of the MIP images confirms the above findings. IMPRESSION: 1. No evidence of pulmonary embolus. 2. Moderate pulmonary edema and trace bilateral pleural effusions. 3. Borderline cardiomegaly. 4. Mildly enlarged mediastinal lymph nodes, likely reactive. 5. Aortic atherosclerosis (ICD10-I70.0). Electronically Signed   By: Yetta Glassman M.D.   On: 02/13/2022 09:50      Assessment/Plan Principal Problem:   Acute CHF [facility]) Active Problems:   Hypertensive urgency   Hypertension   Myocardial injury   Assessment and Plan: * Acute  CHF [facility]) Patient presentation is consistent with acute CHF.  CTA negative for PE, but showed moderate pulmonary edema.  Not sure which type of CHF.  Patient had a 2D echo on 06/23/2017 which showed EF of 50 to 55%.  Given her poorly controlled hypertension, patient may have acute diastolic CHF.  Consulted Dr. Clayborn Bigness of cardiology.   -Will admit to progressive unit as inpatient -Lasix 40 mg bid by IV -2d echo -Daily weights -strict I/O's -Low salt diet -Fluid restriction -Obtain REDs Vest reading -Blood pressure control    Hypertensive urgency Hypertensive urgency and history of hypertension: Blood pressure 192/114.  Patient is taking prednisone at home.  -Patient is on IV Lasix as above -Started Cozaar 100 mg daily, metoprolol 50 mg twice daily, spironolactone 25 mg daily per cardio.  Hypertension -see above  Myocardial injury Myocardial injury due to acute CHF: Troponin level 20.  Has chest pain. -Trend troponin -  Check A1c, FLP, UDS -Aspirin 81 mg daily          DVT ppx: SQ Lovenox  Code Status: Full code  Family Communication: I offered to call her family, but the patient states that she just called her brother, she does not need me to call her family.  Disposition Plan:  Anticipate discharge back to previous environment  Consults called:  Dr. Clayborn Bigness of card  Admission status and Level of care: Telemetry Cardiac:   as inpt       Dispo: The patient is from: Home              Anticipated d/c is to: Home              Anticipated d/c date is: 2 days              Patient currently is not medically stable to d/c.    Severity of Illness:  The appropriate patient status for this patient is INPATIENT. Inpatient status is judged to be reasonable and necessary in order to provide the required intensity of service to ensure the patient's safety. The patient's presenting symptoms, physical exam findings, and initial radiographic and laboratory data in the  context of their chronic comorbidities is felt to place them at high risk for further clinical deterioration. Furthermore, it is not anticipated that the patient will be medically stable for discharge from the hospital within 2 midnights of admission.   * I certify that at the point of admission it is my clinical judgment that the patient will require inpatient hospital care spanning beyond 2 midnights from the point of admission due to high intensity of service, high risk for further deterioration and high frequency of surveillance required.*       Date of Service 02/13/2022    Ivor Costa [facility]   If 7PM-7AM, please contact night-coverage [email or web address] 02/13/2022, 1:16 PM

## 2022-02-13 NOTE — Assessment & Plan Note (Signed)
Patient presentation is consistent with acute CHF.  CTA negative for PE, but showed moderate pulmonary edema.  Not sure which type of CHF.  Patient had a 2D echo on 06/23/2017 which showed EF of 50 to 55%.  Given her poorly controlled hypertension, patient may have acute diastolic CHF.  Consulted Dr. Clayborn Bigness of cardiology.   -Will admit to progressive unit as inpatient -Lasix 40 mg bid by IV -2d echo -Daily weights -strict I/O's -Low salt diet -Fluid restriction -Obtain REDs Vest reading -Blood pressure control

## 2022-02-13 NOTE — Assessment & Plan Note (Signed)
Myocardial injury due to acute CHF: Troponin level 20.  Has chest pain. -Trend troponin -Check A1c, FLP, UDS -Aspirin 81 mg daily

## 2022-02-13 NOTE — Progress Notes (Signed)
PHARMACIST - PHYSICIAN COMMUNICATION  CONCERNING:  Enoxaparin (Lovenox) for DVT Prophylaxis   DESCRIPTION: Patient was prescribed enoxaprin 40mg  q24 hours for VTE prophylaxis.   Filed Weights   02/13/22 0732  Weight: 98.9 kg (218 lb)    Body mass index is 35.19 kg/m.  Estimated Creatinine Clearance: 68.4 mL/min (A) (by C-G formula based on SCr of 1.05 mg/dL (H)).   Based on [place] patient is candidate for enoxaparin 0.5mg /kg TBW SQ every 24 hours based on BMI being >30.   RECOMMENDATION: Pharmacy has adjusted enoxaparin dose per [facility] policy.  Patient is now receiving enoxaparin 50 mg every 24 hours    Darnelle Bos, PharmD Clinical Pharmacist  02/13/2022 11:26 AM

## 2022-02-13 NOTE — Assessment & Plan Note (Signed)
Hypertensive urgency and history of hypertension: Blood pressure 192/114.  Patient is taking prednisone at home.  -Patient is on IV Lasix as above -Started Cozaar 100 mg daily, metoprolol 50 mg twice daily, spironolactone 25 mg daily per cardio.

## 2022-02-13 NOTE — ED Notes (Signed)
Patient eating lunch tray at this time °

## 2022-02-13 NOTE — Consult Note (Signed)
CARDIOLOGY CONSULT NOTE               Patient ID: LATONDA LARRIVEE MRN: [id number] DOB/AGE: [birth year] 61 y.o.  Admit date: 02/13/2022 Referring Physician Dr. Clyde Lundborg ,[facility] hospitalist Primary Physician Hillery Aldo MD primary Primary Cardiologist  Reason for Consultation shortness of breath heart failure hypertensive urgency  HPI: Patient is a 61 year old obese female with hypertension recently seen in emergency room with cough congestion shortness of breath over the past 2 weeks treated for bronchitis with antibiotic therapy steroids patient's had worsening symptoms thought to admission for pneumonia completed antibiotics but continued to have significant symptoms patient came to the emergency room complaining again of coughing up pink-tinged sputum blood pressure has been systolic of 200 diastolic over 100.  Denies any chest pain but has had significant dyspnea shortness of breath and fluid accumulation denies any blackout spells or syncope no previous cardiac history here for evaluation of possible heart failure hypertensive urgency BNP was less than 200  Review of systems complete and found to be negative unless listed above     Past Medical History:  Diagnosis Date   Hypertension     Past Surgical History:  Procedure Laterality Date   ORIF ANKLE FRACTURE Right 06/04/2019   Procedure: OPEN REDUCTION INTERNAL FIXATION WITH DISTAL FIBULA PLATE AND SYNDESMOTIC REPAIR OF RIGHT ANKLE.;  Surgeon: Donato Heinz, MD;  Location: [facility] ORS;  Service: Orthopedics;  Laterality: Right;    (Not in a hospital admission)  Social History   Socioeconomic History   Marital status: Divorced    Spouse name: Not on file   Number of children: Not on file   Years of education: Not on file   Highest education level: Not on file  Occupational History   Not on file  Tobacco Use   Smoking status: Never   Smokeless tobacco: Never  Vaping Use   Vaping Use: Never used  Substance and Sexual  Activity   Alcohol use: No   Drug use: No   Sexual activity: Not on file  Other Topics Concern   Not on file  Social History Narrative   Not on file   Social Determinants of Health   Financial Resource Strain: Not on file  Food Insecurity: Not on file  Transportation Needs: Not on file  Physical Activity: Not on file  Stress: Not on file  Social Connections: Not on file  Intimate Partner Violence: Not on file    Family History  Problem Relation Age of Onset   Breast cancer Neg Hx       Review of systems complete and found to be negative unless listed above      PHYSICAL EXAM  General: Well developed, well nourished, in no acute distress HEENT:  Normocephalic and atramatic Neck:  No JVD.  Lungs: Diffuse rhonchi bilaterally to auscultation and percussion. Heart: HRRR . Normal S1 and S2 with S4 s or murmurs.  Abdomen: Bowel sounds are positive, abdomen soft and non-tender  Msk:  Back normal, normal gait. Normal strength and tone for age. Extremities: No clubbing, cyanosis or edema.   Neuro: Alert and oriented X 3. Psych:  Good affect, responds appropriately  Labs:   Lab Results  Component Value Date   WBC 6.5 02/13/2022   HGB 10.6 (L) 02/13/2022   HCT 34.0 (L) 02/13/2022   MCV 91.9 02/13/2022   PLT 371 02/13/2022    Recent Labs  Lab 02/13/22 0744  NA 137  K 3.5  CL 109  CO2 25  BUN 18  CREATININE 1.05*  CALCIUM 9.0  GLUCOSE 101*   Lab Results  Component Value Date   CKTOTAL 147 09/26/2011   CKMB 1.0 09/26/2011   TROPONINI <0.03 06/22/2017   No results found for: "CHOL" No results found for: "HDL" No results found for: "LDLCALC" No results found for: "TRIG" No results found for: "CHOLHDL" No results found for: "LDLDIRECT"    Radiology: CT Angio Chest PE W and/or Wo Contrast  Result Date: 02/13/2022 CLINICAL DATA:  Shortness of breath EXAM: CT ANGIOGRAPHY CHEST WITH CONTRAST TECHNIQUE: Multidetector CT imaging of the chest was performed using  the standard protocol during bolus administration of intravenous contrast. Multiplanar CT image reconstructions and MIPs were obtained to evaluate the vascular anatomy. RADIATION DOSE REDUCTION: This exam was performed according to the departmental dose-optimization program which includes automated exposure control, adjustment of the mA and/or kV according to patient size and/or use of iterative reconstruction technique. CONTRAST:  17mL OMNIPAQUE IOHEXOL 350 MG/ML SOLN COMPARISON:  CT chest angio dated June 22, 2017 FINDINGS: Cardiovascular: No evidence of pulmonary embolus. Heart is upper limits of normal in size. No pericardial effusion. Normal caliber thoracic aorta with mild atherosclerotic disease. Visible coronary artery calcifications. Mediastinum/Nodes: Small hiatal hernia. Thyroid is unremarkable. Calcified mediastinal and right hilar lymph nodes, likely sequela of prior granulomatous infection. Mildly enlarged mediastinal lymph nodes, likely reactive, reference subcarinal lymph node measuring 1.4 cm in short axis on series 13, image 47. Lungs/Pleura: Central airways are patent. Bronchovascular bundle thickening and smooth interlobular septal thickening which is most pronounced in the lower lungs. Mild diffuse ground-glass opacities. Bibasilar atelectasis and trace bilateral pleural effusions. Upper Abdomen: No acute abnormality. Musculoskeletal: No chest wall abnormality. No acute or significant osseous findings. Review of the MIP images confirms the above findings. IMPRESSION: 1. No evidence of pulmonary embolus. 2. Moderate pulmonary edema and trace bilateral pleural effusions. 3. Borderline cardiomegaly. 4. Mildly enlarged mediastinal lymph nodes, likely reactive. 5. Aortic atherosclerosis (ICD10-I70.0). Electronically Signed   By: Allegra Lai M.D.   On: 02/13/2022 09:50   DG Chest 2 View  Result Date: 02/10/2022 CLINICAL DATA:  Cough and shortness of breath EXAM: CHEST - 2 VIEW  COMPARISON:  02/01/2022 FINDINGS: Cardiac shadow is stable. Lungs are well aerated bilaterally. Increasing basilar opacities are noted when compared with the prior study consistent with progressive infection. No bony abnormality is noted. IMPRESSION: Progressive basilar infiltrates. Electronically Signed   By: Alcide Clever M.D.   On: 02/10/2022 02:13   DG Chest 2 View  Result Date: 02/01/2022 CLINICAL DATA:  Shortness of breath EXAM: CHEST - 2 VIEW COMPARISON:  None Available. FINDINGS: The heart size and mediastinal contours are within normal limits. Right lower lobe heterogeneous pulmonary opacity. No large pleural effusion or pneumothorax. No acute osseous abnormality. The visualized upper abdomen is unremarkable. IMPRESSION: Right lower lobe heterogeneous pulmonary opacity, raising concern for aspiration or infection. Electronically Signed   By: Jacob Moores M.D.   On: 02/01/2022 16:44    EKG: Normal sinus rhythm interventricular conduction delay Q waves anterior septally nonspecific ST-T wave changes rate of 90  ASSESSMENT AND PLAN:  Shortness of breath Congestive heart failure Hypertensive urgency Bronchitis Anemia Obesity Abnormal E KG . Plan Agreed admit to telemetry rule out myocardial infarction follow-up EKGs Short-term anticoagulation DVT prophylaxis doubt ACS Aggressive blood pressure control we will add ACE beta-blocker spironolactone hydralazine as needed for spikes in blood pressure above 160 Echocardiogram for assessment of left ventricular function  valvular structures wall motion Supplemental oxygen as needed Diuretic therapy for congestive heart failure symptoms Presentation consistent with hypertensive urgency and heart failure ultimately we will have to evaluate for possible coronary disease ischemic work-up  Signed: Yolonda Kida MD, 02/13/2022, 12:21 PM

## 2022-02-13 NOTE — Assessment & Plan Note (Signed)
-  see above 

## 2022-02-13 NOTE — ED Provider Notes (Signed)
[facility] Provider Note    Event Date/Time   First MD Initiated Contact with Patient 02/13/22 0745     (approximate)   History   Chief Complaint: Shortness of Breath   HPI  Sara Reed is a 61 y.o. female with a past history of hypertension on lisinopril and HCTZ who comes the ED complaining of persistent cough and shortness of breath for the past 2 weeks.  She was seen in this emergency department by me on October 6 and October 15, presenting with clinically apparent respiratory syndrome of bronchitis and pneumonia.  She completed a course of antibiotics, and then on her second presentation was given Toradol and Decadron with resolution of symptoms at that time.  She reports that since then symptoms have been worsening again and she is coughing up pink-tinged sputum.  No chest pain, no dizziness or syncope.  No vomiting or diarrhea, eating okay.  She reports compliance with her lisinopril/HCTZ 20/25, follows up with primary care Dr. Denton Lank at [facility].     Physical Exam   Triage Vital Signs: ED Triage Vitals  Enc Vitals Group     BP 02/13/22 0744 (!) 190/112     Pulse Rate 02/13/22 0743 94     Resp 02/13/22 0743 17     Temp 02/13/22 0744 (!) 97.3 F (36.3 C)     Temp Source 02/13/22 0744 Oral     SpO2 02/13/22 0743 97 %     Weight 02/13/22 0732 218 lb (98.9 kg)     Height 02/13/22 0732 5\' 6"  (1.676 m)     Head Circumference --      Peak Flow --      Pain Score 02/13/22 0731 0     Pain Loc --      Pain Edu? --      Excl. in [place]? --     Most recent vital signs: Vitals:   02/13/22 1000 02/13/22 1100  BP: (!) 192/109 (!) 192/109  Pulse: 100 100  Resp: 17 19  Temp:    SpO2: 91% 94%    General: Awake, no distress.  CV:  Good peripheral perfusion.  Regular rate and rhythm Resp:  Normal effort.  Bibasilar crackles.  Expiratory wheeze with coughing. Abd:  No distention.  Soft, nontender  Other:  no lower extremity edema or calf  tenderness.   ED Results / Procedures / Treatments   Labs (all labs ordered are listed, but only abnormal results are displayed) Labs Reviewed  CBC WITH DIFFERENTIAL/PLATELET - Abnormal; Notable for the following components:      Result Value   RBC 3.70 (*)    Hemoglobin 10.6 (*)    HCT 34.0 (*)    RDW 15.7 (*)    All other components within normal limits  BASIC METABOLIC PANEL - Abnormal; Notable for the following components:   Glucose, Bld 101 (*)    Creatinine, Ser 1.05 (*)    Anion gap 3 (*)    All other components within normal limits  BRAIN NATRIURETIC PEPTIDE - Abnormal; Notable for the following components:   B Natriuretic Peptide 189.4 (*)    All other components within normal limits  TROPONIN I (HIGH SENSITIVITY) - Abnormal; Notable for the following components:   Troponin I (High Sensitivity) 20 (*)    All other components within normal limits  TROPONIN I (HIGH SENSITIVITY) - Abnormal; Notable for the following components:   Troponin I (High Sensitivity) 18 (*)  All other components within normal limits     EKG Interpreted by me Sinus rhythm rate of 92.  Normal axis.  Left bundle branch block.  No acute ischemic changes.   RADIOLOGY CT angiogram of the chest interpreted by me, negative for PE.  Radiology report reviewed, noting diffuse pulmonary edema.   PROCEDURES:  Procedures   MEDICATIONS ORDERED IN ED: Medications  hydrALAZINE (APRESOLINE) injection 10 mg (has no administration in time range)  lactated ringers bolus 1,000 mL (1,000 mLs Intravenous New Bag/Given 02/13/22 0816)  methylPREDNISolone sodium succinate (SOLU-MEDROL) 125 mg/2 mL injection 125 mg (125 mg Intravenous Given 02/13/22 0813)  ipratropium-albuterol (DUONEB) 0.5-2.5 (3) MG/3ML nebulizer solution 3 mL (3 mLs Nebulization Given 02/13/22 0811)  albuterol (PROVENTIL) (2.5 MG/3ML) 0.083% nebulizer solution 5 mg (5 mg Nebulization Given 02/13/22 0817)  iohexol (OMNIPAQUE) 350 MG/ML  injection 75 mL (75 mLs Intravenous Contrast Given 02/13/22 0906)  furosemide (LASIX) injection 40 mg (40 mg Intravenous Given 02/13/22 1031)     IMPRESSION / MDM / ASSESSMENT AND PLAN / ED COURSE  I reviewed the triage vital signs and the nursing notes.                              Differential diagnosis includes, but is not limited to, bronchitis, pneumonia, pleural effusion, pulmonary edema, pneumothorax, reactive airway disease, pulm embolism, pericardial effusion/carditis  Patient's presentation is most consistent with acute presentation with potential threat to life or bodily function.  Patient presents for third time with similar symptoms of cough, shortness of breath despite prior therapies for pulmonary etiology.  Will give Solu-Medrol and bronchodilators while obtaining labs and CT angiogram of the chest to evaluate for PE.   ----------------------------------------- 10:28 AM on 02/13/2022 ----------------------------------------- CT angiogram of the chest is negative for PE but does reveal moderate pulmonary edema indicative of new onset heart failure.  This may be related to uncontrolled hypertension.  We will give a dose of IV Lasix.  Discussed the severity of her symptoms with her to engage in shared decision making about disposition.  Patient will be better served by hospitalization for diuresis, symptom monitoring, cardiac work-up.  Case discussed with hospitalist..      FINAL CLINICAL IMPRESSION(S) / ED DIAGNOSES   Final diagnoses:  New onset of congestive heart failure (HCC)  Uncontrolled hypertension     Rx / DC Orders   ED Discharge Orders     None        Note:  This document was prepared using Dragon voice recognition software and may include unintentional dictation errors.   Sharman Cheek, MD 02/13/22 1113

## 2022-02-13 NOTE — Progress Notes (Signed)
*  PRELIMINARY RESULTS* Echocardiogram 2D Echocardiogram has been performed.  Sara Reed 02/13/2022, 1:48 PM

## 2022-02-14 DIAGNOSIS — I5041 Acute combined systolic (congestive) and diastolic (congestive) heart failure: Secondary | ICD-10-CM

## 2022-02-14 DIAGNOSIS — R7303 Prediabetes: Secondary | ICD-10-CM

## 2022-02-14 LAB — BASIC METABOLIC PANEL
Anion gap: 7 (ref 5–15)
BUN: 28 mg/dL — ABNORMAL HIGH (ref 6–20)
CO2: 23 mmol/L (ref 22–32)
Calcium: 9.2 mg/dL (ref 8.9–10.3)
Chloride: 108 mmol/L (ref 98–111)
Creatinine, Ser: 1.17 mg/dL — ABNORMAL HIGH (ref 0.44–1.00)
GFR, Estimated: 54 mL/min — ABNORMAL LOW (ref >60)
Glucose, Bld: 124 mg/dL — ABNORMAL HIGH (ref 70–99)
Potassium: 3.7 mmol/L (ref 3.5–5.1)
Sodium: 138 mmol/L (ref 135–145)

## 2022-02-14 LAB — MAGNESIUM: Magnesium: 2.2 mg/dL (ref 1.7–2.4)

## 2022-02-14 LAB — HEMOGLOBIN A1C
Hgb A1c MFr Bld: 6.1 % — ABNORMAL HIGH (ref 4.8–5.6)
Mean Plasma Glucose: 128.37 mg/dL

## 2022-02-14 MED ORDER — METOPROLOL TARTRATE 50 MG PO TABS
100.0000 mg | ORAL_TABLET | Freq: Two times a day (BID) | ORAL | Status: DC
  Administered 2022-02-14 – 2022-02-15 (×2): 100 mg via ORAL
  Filled 2022-02-14 (×2): qty 4

## 2022-02-14 MED ORDER — AMLODIPINE BESYLATE 5 MG PO TABS
5.0000 mg | ORAL_TABLET | Freq: Two times a day (BID) | ORAL | Status: DC
  Administered 2022-02-14 – 2022-02-15 (×2): 5 mg via ORAL
  Filled 2022-02-14 (×2): qty 1

## 2022-02-14 NOTE — Consult Note (Addendum)
   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  Mild concentric LVH.  Grade 1 diastolic dysfunction.  Mild mitral regurgitation.  She presented to the emergency room with complaints of shortness of breath increasing over the last 3 weeks and hypertension.  He had also noted a productive cough.  BNP 189.  Chest CT revealed moderate pulmonary edema.  Had previously been seen in the ED on October 6 and October 15 being treated for possible bronchitis.  She does not have insurance.  Comorbidities:  Hypertension Obesity  Medications:  Aspirin 81 mg daily  Furosemide 40 mg IV every 12 Losartan 100 mg daily Metoprolol tartrate 50 mg 2 times a day Spironolactone 25 mg daily  Labs:  Sodium 138, potassium 3.7, chloride 108, CO2 23, BUN 28, creatinine 1.17 up from 1.05 of yesterday.  Estimated GFR 54.  Magnesium 2.2.  Hemoglobin A1c 6.1.   Initial meeting with patient in the ED, she was lying quietly in bed in no acute distress.  She states that she can get up and walk around her room and she feels much better than she did prior to admission.  When discussing her symptoms that brought her in she also said she had noted PND and orthopnea.  We discussed heart failure, she states that she has not heard that term in relation to ship to herself but when I asked her what it means to her she said that the heart is not working as it should.  Discussed diet, she does not add salt at the table and has not been doing that for a long time trying to keep her blood pressures under better control.  Tries to eat healthy with eating salmon and tuna, she does admit to using spam rarely and knows that that is high in sodium content.  Discussed using other spices other than anything that has salt and its title or content.  Went over fluid restriction.  Discussed sticking with 64 ounces or less daily.  She states that she is compliant with her medications.  Discussed follow-up in the outpatient heart  failure clinic for which she has a appointment on October 30 at 8:30 in the morning.  She has 11% no-show which is 2 out of 19 appointments.   She was giving the living with heart failure teaching booklet, zone magnet, info on heart failure and sodium along with weight chart.  She had no further questions at this time we will continue to follow along.  Pricilla Riffle RN CHFN

## 2022-02-14 NOTE — Progress Notes (Signed)
[facility] Cardiology    SUBJECTIVE: Patient states to be done reasonably well today less shortness of breath no chest pain sitting up in bed eating   Vitals:   02/14/22 0115 02/14/22 0130 02/14/22 0425 02/14/22 0827  BP:   (!) 175/109 (!) 171/105  Pulse: 74 77 79 91  Resp: 15 17 18 19   Temp:   98.2 F (36.8 C) 98.4 F (36.9 C)  TempSrc:   Oral Oral  SpO2: 98% 96% 97% 99%  Weight:      Height:        No intake or output data in the 24 hours ending 02/14/22 1253    PHYSICAL EXAM  General: Well developed, well nourished, in no acute distress HEENT:  Normocephalic and atramatic Neck:  No JVD.  Lungs: Clear bilaterally to auscultation and percussion. Heart: HRRR . Normal S1 and S2 without gallops or murmurs.  Abdomen: Bowel sounds are positive, abdomen soft and non-tender  Msk:  Back normal, normal gait. Normal strength and tone for age. Extremities: No clubbing, cyanosis or edema.   Neuro: Alert and oriented X 3. Psych:  Good affect, responds appropriately   LABS: Basic Metabolic Panel: Recent Labs    02/13/22 0744 02/14/22 0419  NA 137 138  K 3.5 3.7  CL 109 108  CO2 25 23  GLUCOSE 101* 124*  BUN 18 28*  CREATININE 1.05* 1.17*  CALCIUM 9.0 9.2  MG  --  2.2   Liver Function Tests: No results for input(s): "AST", "ALT", "ALKPHOS", "BILITOT", "PROT", "ALBUMIN" in the last 72 hours. No results for input(s): "LIPASE", "AMYLASE" in the last 72 hours. CBC: Recent Labs    02/13/22 0744  WBC 6.5  NEUTROABS 3.1  HGB 10.6*  HCT 34.0*  MCV 91.9  PLT 371   Cardiac Enzymes: No results for input(s): "CKTOTAL", "CKMB", "CKMBINDEX", "TROPONINI" in the last 72 hours. BNP: Invalid input(s): "POCBNP" D-Dimer: No results for input(s): "DDIMER" in the last 72 hours. Hemoglobin A1C: Recent Labs    02/14/22 0419  HGBA1C 6.1*   Fasting Lipid Panel: Recent Labs    02/13/22 1031  CHOL 240*  HDL 62  LDLCALC 161*  TRIG 86  CHOLHDL 3.9   Thyroid Function Tests: No  results for input(s): "TSH", "T4TOTAL", "T3FREE", "THYROIDAB" in the last 72 hours.  Invalid input(s): "FREET3" Anemia Panel: No results for input(s): "VITAMINB12", "FOLATE", "FERRITIN", "TIBC", "IRON", "RETICCTPCT" in the last 72 hours.  ECHOCARDIOGRAM COMPLETE  Result Date: 02/13/2022    ECHOCARDIOGRAM REPORT   Patient Name:   Sara Reed Date of Exam: 02/13/2022 Medical Rec #:  02/15/2022       Height:       66.0 in Accession #:    [id number]      Weight:       218.0 lb Date of Birth:  [birth year]       BSA:          2.074 m Patient Age:    61 years        BP:           189/114 mmHg Patient Gender: F               HR:           104 bpm. Exam Location:  [facility] Procedure: 2D Echo, Cardiac Doppler and Color Doppler Indications:     CHF-acute diastolic I50.31  History:         Patient has prior history of Echocardiogram examinations, most  recent 06/23/2017. Risk Factors:Hypertension.  Sonographer:     Cristela Blue Referring Phys:  [id number] Lorretta Harp Diagnosing Phys: Alwyn Pea MD  Sonographer Comments: Suboptimal apical window. IMPRESSIONS  1. Left ventricular ejection fraction, by estimation, is 40 to 45%. The left ventricle has mildly decreased function. The left ventricle demonstrates global hypokinesis. There is mild concentric left ventricular hypertrophy. Left ventricular diastolic parameters are consistent with Grade I diastolic dysfunction (impaired relaxation).  2. Right ventricular systolic function is normal. The right ventricular size is normal.  3. The mitral valve is normal in structure. Mild mitral valve regurgitation.  4. The aortic valve is normal in structure. Aortic valve regurgitation is not visualized. FINDINGS  Left Ventricle: Left ventricular ejection fraction, by estimation, is 40 to 45%. The left ventricle has mildly decreased function. The left ventricle demonstrates global hypokinesis. The left ventricular internal cavity size was normal in size. There is  mild  concentric left ventricular hypertrophy. Left ventricular diastolic parameters are consistent with Grade I diastolic dysfunction (impaired relaxation). Right Ventricle: The right ventricular size is normal. No increase in right ventricular wall thickness. Right ventricular systolic function is normal. Left Atrium: Left atrial size was normal in size. Right Atrium: Right atrial size was normal in size. Pericardium: There is no evidence of pericardial effusion. Mitral Valve: The mitral valve is normal in structure. Mild mitral valve regurgitation. Tricuspid Valve: The tricuspid valve is normal in structure. Tricuspid valve regurgitation is mild. Aortic Valve: The aortic valve is normal in structure. Aortic valve regurgitation is not visualized. Aortic valve mean gradient measures 10.0 mmHg. Aortic valve peak gradient measures 15.7 mmHg. Aortic valve area, by VTI measures 3.13 cm. Pulmonic Valve: The pulmonic valve was normal in structure. Pulmonic valve regurgitation is not visualized. Aorta: The ascending aorta was not well visualized. IAS/Shunts: No atrial level shunt detected by color flow Doppler.  LEFT VENTRICLE PLAX 2D LVIDd:         4.00 cm     Diastology LVIDs:         3.20 cm     LV e' medial:    12.80 cm/s LV PW:         1.40 cm     LV E/e' medial:  12.9 LV IVS:        1.30 cm     LV e' lateral:   14.00 cm/s LVOT diam:     2.00 cm     LV E/e' lateral: 11.8 LV SV:         101 LV SV Index:   49 LVOT Area:     3.14 cm  LV Volumes (MOD) LV vol d, MOD A2C: 76.5 ml LV vol d, MOD A4C: 96.7 ml LV vol s, MOD A2C: 29.0 ml LV vol s, MOD A4C: 67.2 ml LV SV MOD A2C:     47.5 ml LV SV MOD A4C:     96.7 ml LV SV MOD BP:      38.4 ml RIGHT VENTRICLE RV Basal diam:  2.40 cm RV Mid diam:    2.00 cm LEFT ATRIUM             Index        RIGHT ATRIUM           Index LA diam:        3.20 cm 1.54 cm/m   RA Area:     10.60 cm LA Vol (A2C):   65.0 ml 31.33 ml/m  RA Volume:  17.00 ml  8.20 ml/m LA Vol (A4C):   42.4 ml 20.44  ml/m LA Biplane Vol: 52.0 ml 25.07 ml/m  AORTIC VALVE AV Area (Vmax):    3.24 cm AV Area (Vmean):   2.86 cm AV Area (VTI):     3.13 cm AV Vmax:           198.00 cm/s AV Vmean:          146.000 cm/s AV VTI:            0.324 m AV Peak Grad:      15.7 mmHg AV Mean Grad:      10.0 mmHg LVOT Vmax:         204.00 cm/s LVOT Vmean:        133.000 cm/s LVOT VTI:          0.323 m LVOT/AV VTI ratio: 1.00  AORTA Ao Root diam: 3.10 cm MITRAL VALVE                TRICUSPID VALVE MV Area (PHT): 7.16 cm     TR Peak grad:   16.8 mmHg MV Decel Time: 106 msec     TR Vmax:        205.00 cm/s MV E velocity: 165.00 cm/s                             SHUNTS                             Systemic VTI:  0.32 m                             Systemic Diam: 2.00 cm Alwyn Pea MD Electronically signed by Alwyn Pea MD Signature Date/Time: 02/13/2022/4:16:58 PM    Final    CT Angio Chest PE W and/or Wo Contrast  Result Date: 02/13/2022 CLINICAL DATA:  Shortness of breath EXAM: CT ANGIOGRAPHY CHEST WITH CONTRAST TECHNIQUE: Multidetector CT imaging of the chest was performed using the standard protocol during bolus administration of intravenous contrast. Multiplanar CT image reconstructions and MIPs were obtained to evaluate the vascular anatomy. RADIATION DOSE REDUCTION: This exam was performed according to the departmental dose-optimization program which includes automated exposure control, adjustment of the mA and/or kV according to patient size and/or use of iterative reconstruction technique. CONTRAST:  38mL OMNIPAQUE IOHEXOL 350 MG/ML SOLN COMPARISON:  CT chest angio dated June 22, 2017 FINDINGS: Cardiovascular: No evidence of pulmonary embolus. Heart is upper limits of normal in size. No pericardial effusion. Normal caliber thoracic aorta with mild atherosclerotic disease. Visible coronary artery calcifications. Mediastinum/Nodes: Small hiatal hernia. Thyroid is unremarkable. Calcified mediastinal and right hilar  lymph nodes, likely sequela of prior granulomatous infection. Mildly enlarged mediastinal lymph nodes, likely reactive, reference subcarinal lymph node measuring 1.4 cm in short axis on series 13, image 47. Lungs/Pleura: Central airways are patent. Bronchovascular bundle thickening and smooth interlobular septal thickening which is most pronounced in the lower lungs. Mild diffuse ground-glass opacities. Bibasilar atelectasis and trace bilateral pleural effusions. Upper Abdomen: No acute abnormality. Musculoskeletal: No chest wall abnormality. No acute or significant osseous findings. Review of the MIP images confirms the above findings. IMPRESSION: 1. No evidence of pulmonary embolus. 2. Moderate pulmonary edema and trace bilateral pleural effusions. 3. Borderline cardiomegaly. 4. Mildly enlarged mediastinal lymph nodes, likely reactive. 5. Aortic  atherosclerosis (ICD10-I70.0). Electronically Signed   By: Yetta Glassman M.D.   On: 02/13/2022 09:50     Echo mild reduced left ventricular function 40 to 45%  TELEMETRY: Normal sinus rhythm interventricular conduction delay LVH nonspecific ST-T wave changes rate of 90:  ASSESSMENT AND PLAN:  Principal Problem:   Acute CHF ([place]) Active Problems:   Hypertension   Myocardial injury   Hypertensive urgency    Plan Continue hypertensive management and control Add amlodipine 5 mg twice a day Increase metoprolol to 100 mg twice a day Continue current medical therapy with blood pressure support  Yolonda Kida, MD 02/14/2022 12:53 PM

## 2022-02-14 NOTE — Progress Notes (Signed)
TOC consulted for heart failure screen. Heart failure RN Jimsey has been informed.  ° ° °Quinto Tippy, LCSW °[phone] ° °

## 2022-02-14 NOTE — Progress Notes (Signed)
PROGRESS NOTE    Sara Reed  [id number] DOB: 1961/03/31 DOA: 02/13/2022 PCP: Denton Lank, MD   Assessment & Plan:   Principal Problem:   Acute CHF [facility]) Active Problems:   Hypertensive urgency   Hypertension   Myocardial injury  Assessment and Plan:  Acute combined CHF: echo on 06/23/2017 which showed EF of 50 to 55%.  Repeat echo shows EF [id number], grade I diastolic dysfunction, LV demonstrates global hypokinesis, mild MR & no atrial shunts detected. Continue on IV lasix and po aldactone, losartan, metoprolol. Monitor I/Os & daily weights. CTA chest neg for PE, but showed moderate pulmonary edema.    Hypertensive urgency: continue on losartan, metoprolol, aldactone.  Likely AKI: Cr is trending up slightly from day prior. Possibly secondary to lasix use.    Elevated troponin: minimal. Likely secondary to demand ischemia from acute CHF.   Obesity: BMI 35.1. Complicates overall care & prognosis   Normocytic anemia: H&H are stable. No need for a transfusion currently   Pre-DM: HbA1c 6.1. Needs diet and exercise education   DVT prophylaxis: lovenox  Code Status: full  Family Communication: Disposition Plan: likely d/c back home   Level of care: Telemetry Cardiac  Status is: Inpatient Remains inpatient appropriate because: severity of illness     Consultants:  Cardio   Procedures:   Antimicrobials:   Subjective: Pt c/o malaise  Objective: Vitals:   02/14/22 0115 02/14/22 0130 02/14/22 0425 02/14/22 0827  BP:   (!) 175/109 (!) 171/105  Pulse: 74 77 79 91  Resp: 15 17 18 19   Temp:   98.2 F (36.8 C) 98.4 F (36.9 C)  TempSrc:   Oral Oral  SpO2: 98% 96% 97% 99%  Weight:      Height:       No intake or output data in the 24 hours ending 02/14/22 0830 Filed Weights   02/13/22 0732  Weight: 98.9 kg    Examination:  General exam: Appears calm and comfortable  Respiratory system: diminished breath sounds b/l  Cardiovascular system: S1 & S2 +. No  rubs, gallops or clicks.  Gastrointestinal system: Abdomen is obese, soft and nontender.  Normal bowel sounds heard. Central nervous system: Alert and oriented. Moves all extremities  Psychiatry: Judgement and insight appear normal. Mood & affect appropriate.     Data Reviewed: I have personally reviewed following labs and imaging studies  CBC: Recent Labs  Lab 02/10/22 0149 02/13/22 0744  WBC 7.7 6.5  NEUTROABS 3.8 3.1  HGB 10.5* 10.6*  HCT 33.5* 34.0*  MCV 91.5 91.9  PLT 362 [id number]   Basic Metabolic Panel: Recent Labs  Lab 02/10/22 0149 02/13/22 0744 02/14/22 0419  NA 138 137 138  K 3.5 3.5 3.7  CL 111 109 108  CO2 21* 25 23  GLUCOSE 130* 101* 124*  BUN 18 18 28*  CREATININE 1.03* 1.05* 1.17*  CALCIUM 8.9 9.0 9.2  MG  --   --  2.2   GFR: Estimated Creatinine Clearance: 61.4 mL/min (A) (by C-G formula based on SCr of 1.17 mg/dL (H)). Liver Function Tests: No results for input(s): "AST", "ALT", "ALKPHOS", "BILITOT", "PROT", "ALBUMIN" in the last 168 hours. No results for input(s): "LIPASE", "AMYLASE" in the last 168 hours. No results for input(s): "AMMONIA" in the last 168 hours. Coagulation Profile: No results for input(s): "INR", "PROTIME" in the last 168 hours. Cardiac Enzymes: No results for input(s): "CKTOTAL", "CKMB", "CKMBINDEX", "TROPONINI" in the last 168 hours. BNP (last 3 results) No results for  input(s): "PROBNP" in the last 8760 hours. HbA1C: No results for input(s): "HGBA1C" in the last 72 hours. CBG: No results for input(s): "GLUCAP" in the last 168 hours. Lipid Profile: Recent Labs    02/13/22 1031  CHOL 240*  HDL 62  LDLCALC 161*  TRIG 86  CHOLHDL 3.9   Thyroid Function Tests: No results for input(s): "TSH", "T4TOTAL", "FREET4", "T3FREE", "THYROIDAB" in the last 72 hours. Anemia Panel: No results for input(s): "VITAMINB12", "FOLATE", "FERRITIN", "TIBC", "IRON", "RETICCTPCT" in the last 72 hours. Sepsis Labs: No results for input(s):  "PROCALCITON", "LATICACIDVEN" in the last 168 hours.  No results found for this or any previous visit (from the past 240 hour(s)).       Radiology Studies: ECHOCARDIOGRAM COMPLETE  Result Date: 02/13/2022    ECHOCARDIOGRAM REPORT   Patient Name:   Sara Reed Date of Exam: 02/13/2022 Medical Rec #:  [id number]       Height:       66.0 in Accession #:    [id number]      Weight:       218.0 lb Date of Birth:  [birth year]       BSA:          2.074 m Patient Age:    61 years        BP:           189/114 mmHg Patient Gender: F               HR:           104 bpm. Exam Location:  [facility] Procedure: 2D Echo, Cardiac Doppler and Color Doppler Indications:     CHF-acute diastolic E52.77  History:         Patient has prior history of Echocardiogram examinations, most                  recent 06/23/2017. Risk Factors:Hypertension.  Sonographer:     Sherrie Sport Referring Phys:  [id number] Ivor Costa Diagnosing Phys: Yolonda Kida MD  Sonographer Comments: Suboptimal apical window. IMPRESSIONS  1. Left ventricular ejection fraction, by estimation, is 40 to 45%. The left ventricle has mildly decreased function. The left ventricle demonstrates global hypokinesis. There is mild concentric left ventricular hypertrophy. Left ventricular diastolic parameters are consistent with Grade I diastolic dysfunction (impaired relaxation).  2. Right ventricular systolic function is normal. The right ventricular size is normal.  3. The mitral valve is normal in structure. Mild mitral valve regurgitation.  4. The aortic valve is normal in structure. Aortic valve regurgitation is not visualized. FINDINGS  Left Ventricle: Left ventricular ejection fraction, by estimation, is 40 to 45%. The left ventricle has mildly decreased function. The left ventricle demonstrates global hypokinesis. The left ventricular internal cavity size was normal in size. There is  mild concentric left ventricular hypertrophy. Left ventricular diastolic parameters are  consistent with Grade I diastolic dysfunction (impaired relaxation). Right Ventricle: The right ventricular size is normal. No increase in right ventricular wall thickness. Right ventricular systolic function is normal. Left Atrium: Left atrial size was normal in size. Right Atrium: Right atrial size was normal in size. Pericardium: There is no evidence of pericardial effusion. Mitral Valve: The mitral valve is normal in structure. Mild mitral valve regurgitation. Tricuspid Valve: The tricuspid valve is normal in structure. Tricuspid valve regurgitation is mild. Aortic Valve: The aortic valve is normal in structure. Aortic valve regurgitation is not visualized. Aortic valve mean gradient measures 10.0 mmHg. Aortic valve  peak gradient measures 15.7 mmHg. Aortic valve area, by VTI measures 3.13 cm. Pulmonic Valve: The pulmonic valve was normal in structure. Pulmonic valve regurgitation is not visualized. Aorta: The ascending aorta was not well visualized. IAS/Shunts: No atrial level shunt detected by color flow Doppler.  LEFT VENTRICLE PLAX 2D LVIDd:         4.00 cm     Diastology LVIDs:         3.20 cm     LV e' medial:    12.80 cm/s LV PW:         1.40 cm     LV E/e' medial:  12.9 LV IVS:        1.30 cm     LV e' lateral:   14.00 cm/s LVOT diam:     2.00 cm     LV E/e' lateral: 11.8 LV SV:         101 LV SV Index:   49 LVOT Area:     3.14 cm  LV Volumes (MOD) LV vol d, MOD A2C: 76.5 ml LV vol d, MOD A4C: 96.7 ml LV vol s, MOD A2C: 29.0 ml LV vol s, MOD A4C: 67.2 ml LV SV MOD A2C:     47.5 ml LV SV MOD A4C:     96.7 ml LV SV MOD BP:      38.4 ml RIGHT VENTRICLE RV Basal diam:  2.40 cm RV Mid diam:    2.00 cm LEFT ATRIUM             Index        RIGHT ATRIUM           Index LA diam:        3.20 cm 1.54 cm/m   RA Area:     10.60 cm LA Vol (A2C):   65.0 ml 31.33 ml/m  RA Volume:   17.00 ml  8.20 ml/m LA Vol (A4C):   42.4 ml 20.44 ml/m LA Biplane Vol: 52.0 ml 25.07 ml/m  AORTIC VALVE AV Area (Vmax):    3.24 cm  AV Area (Vmean):   2.86 cm AV Area (VTI):     3.13 cm AV Vmax:           198.00 cm/s AV Vmean:          146.000 cm/s AV VTI:            0.324 m AV Peak Grad:      15.7 mmHg AV Mean Grad:      10.0 mmHg LVOT Vmax:         204.00 cm/s LVOT Vmean:        133.000 cm/s LVOT VTI:          0.323 m LVOT/AV VTI ratio: 1.00  AORTA Ao Root diam: 3.10 cm MITRAL VALVE                TRICUSPID VALVE MV Area (PHT): 7.16 cm     TR Peak grad:   16.8 mmHg MV Decel Time: 106 msec     TR Vmax:        205.00 cm/s MV E velocity: 165.00 cm/s                             SHUNTS  Systemic VTI:  0.32 m                             Systemic Diam: 2.00 cm Yolonda Kida MD Electronically signed by Yolonda Kida MD Signature Date/Time: 02/13/2022/4:16:58 PM    Final    CT Angio Chest PE W and/or Wo Contrast  Result Date: 02/13/2022 CLINICAL DATA:  Shortness of breath EXAM: CT ANGIOGRAPHY CHEST WITH CONTRAST TECHNIQUE: Multidetector CT imaging of the chest was performed using the standard protocol during bolus administration of intravenous contrast. Multiplanar CT image reconstructions and MIPs were obtained to evaluate the vascular anatomy. RADIATION DOSE REDUCTION: This exam was performed according to the departmental dose-optimization program which includes automated exposure control, adjustment of the mA and/or kV according to patient size and/or use of iterative reconstruction technique. CONTRAST:  20mL OMNIPAQUE IOHEXOL 350 MG/ML SOLN COMPARISON:  CT chest angio dated June 22, 2017 FINDINGS: Cardiovascular: No evidence of pulmonary embolus. Heart is upper limits of normal in size. No pericardial effusion. Normal caliber thoracic aorta with mild atherosclerotic disease. Visible coronary artery calcifications. Mediastinum/Nodes: Small hiatal hernia. Thyroid is unremarkable. Calcified mediastinal and right hilar lymph nodes, likely sequela of prior granulomatous infection. Mildly enlarged  mediastinal lymph nodes, likely reactive, reference subcarinal lymph node measuring 1.4 cm in short axis on series 13, image 47. Lungs/Pleura: Central airways are patent. Bronchovascular bundle thickening and smooth interlobular septal thickening which is most pronounced in the lower lungs. Mild diffuse ground-glass opacities. Bibasilar atelectasis and trace bilateral pleural effusions. Upper Abdomen: No acute abnormality. Musculoskeletal: No chest wall abnormality. No acute or significant osseous findings. Review of the MIP images confirms the above findings. IMPRESSION: 1. No evidence of pulmonary embolus. 2. Moderate pulmonary edema and trace bilateral pleural effusions. 3. Borderline cardiomegaly. 4. Mildly enlarged mediastinal lymph nodes, likely reactive. 5. Aortic atherosclerosis (ICD10-I70.0). Electronically Signed   By: Yetta Glassman M.D.   On: 02/13/2022 09:50        Scheduled Meds:  aspirin EC  81 mg Oral Daily   enoxaparin (LOVENOX) injection  0.5 mg/kg Subcutaneous Q24H   furosemide  40 mg Intravenous Q12H   losartan  100 mg Oral Daily   metoprolol tartrate  50 mg Oral BID   spironolactone  25 mg Oral Daily   Continuous Infusions:   LOS: 1 day    Time spent: 35 mins     Wyvonnia Dusky, MD [facility] Pager [phone]  If 7PM-7AM, please contact night-coverage  02/14/2022, 8:30 AM

## 2022-02-15 DIAGNOSIS — N179 Acute kidney failure, unspecified: Secondary | ICD-10-CM

## 2022-02-15 LAB — HIV ANTIBODY (ROUTINE TESTING W REFLEX): HIV Screen 4th Generation wRfx: NONREACTIVE

## 2022-02-15 LAB — CBC
HCT: 35.5 % — ABNORMAL LOW (ref 36.0–46.0)
Hemoglobin: 11.4 g/dL — ABNORMAL LOW (ref 12.0–15.0)
MCH: 28.8 pg (ref 26.0–34.0)
MCHC: 32.1 g/dL (ref 30.0–36.0)
MCV: 89.6 fL (ref 80.0–100.0)
Platelets: 372 10*3/uL (ref 150–400)
RBC: 3.96 MIL/uL (ref 3.87–5.11)
RDW: 15.7 % — ABNORMAL HIGH (ref 11.5–15.5)
WBC: 12.4 10*3/uL — ABNORMAL HIGH (ref 4.0–10.5)
nRBC: 0 % (ref 0.0–0.2)

## 2022-02-15 LAB — BASIC METABOLIC PANEL
Anion gap: 5 (ref 5–15)
BUN: 34 mg/dL — ABNORMAL HIGH (ref 6–20)
CO2: 29 mmol/L (ref 22–32)
Calcium: 8.6 mg/dL — ABNORMAL LOW (ref 8.9–10.3)
Chloride: 105 mmol/L (ref 98–111)
Creatinine, Ser: 1.29 mg/dL — ABNORMAL HIGH (ref 0.44–1.00)
GFR, Estimated: 48 mL/min — ABNORMAL LOW (ref >60)
Glucose, Bld: 101 mg/dL — ABNORMAL HIGH (ref 70–99)
Potassium: 3.8 mmol/L (ref 3.5–5.1)
Sodium: 139 mmol/L (ref 135–145)

## 2022-02-15 LAB — GLUCOSE, CAPILLARY: Glucose-Capillary: 104 mg/dL — ABNORMAL HIGH (ref 70–99)

## 2022-02-15 MED ORDER — SPIRONOLACTONE 25 MG PO TABS: 25.0000 mg | ORAL_TABLET | Freq: Every day | ORAL | 0 refills | Status: DC

## 2022-02-15 MED ORDER — AMLODIPINE BESYLATE 5 MG PO TABS: 5.0000 mg | ORAL_TABLET | Freq: Two times a day (BID) | ORAL | 0 refills | Status: AC

## 2022-02-15 MED ORDER — FUROSEMIDE 40 MG PO TABS: 40.0000 mg | ORAL_TABLET | Freq: Every day | ORAL | 0 refills | Status: DC

## 2022-02-15 MED ORDER — METOPROLOL TARTRATE 100 MG PO TABS: 100.0000 mg | ORAL_TABLET | Freq: Two times a day (BID) | ORAL | 0 refills | Status: DC

## 2022-02-15 MED ORDER — LOSARTAN POTASSIUM 100 MG PO TABS: 100.0000 mg | ORAL_TABLET | Freq: Every day | ORAL | 0 refills | Status: DC

## 2022-02-15 NOTE — Discharge Summary (Signed)
Physician Discharge Summary  Sara Reed YTK:[id number] DOB: [birth year] DOA: 02/13/2022  PCP: Hillery Aldo, MD  Admit date: 02/13/2022 Discharge date: 02/15/2022  Admitted From: home  Disposition:  home   Recommendations for Outpatient Follow-up:  Follow up with PCP in 1-2 weeks F/u w/ cardio, Dr. Juliann Pares, in 1 week  Home Health: no  Equipment/Devices:  Discharge Condition: stable  CODE STATUS: full  Diet recommendation: Heart Healthy / Carb Modified  Brief/Interim Summary: HPI was taken from Dr. Clyde Lundborg: Sara Reed is a 61 y.o. female with medical history significant of HTN, who presents with SOB   Patient has SOB for about 3 weeks.  Patient was seen in ED on 10/6 and 10/15. She was treated as possible bronchitis with a course of Augmentin and was given Decadron.  Patient continued to have shortness breath. She coughs up pink-tinged mucus.  Denies chest pain, fever or chills.  No nausea, vomiting, diarrhea or abdominal pain.  No symptoms of UTI.  Patient has trace bilateral leg edema.   Data reviewed independently and ED Course: pt was found to have troponin level 20, BNP 189, WBC 6.5, GFR> 60, temperature 97.3, blood pressure 192/114, heart rate 108, RR 17, oxygen saturation 92-94% on room air.  CTA is negative for PE, but showed moderate pulmonary edema.  Patient is admitted to telemetry bed as inpatient.  Dr. Juliann Pares of cardiology is consulted.     As per Dr. Mayford Knife 10/19-10/20/23: Pt was found to have acute combined CHF exacerbation on admission. Repeat echo showed EF 40-45%, grade I diastolic dysfunction, LV demonstrates global hypokinesis, & mild MR. Pt was treated w/ lasix, metoprolol, aldactone, losartan & amlodipine as per cardio. Pt will f/u outpatient w/ cardio, Dr. Juliann Pares, in 1 week. Pt verbalized her understanding. Pt also received CHF education while inpatient. Pt was ambulating and transferring independently so therapy was not indicated. For more information,  please see previous progress/consult notes.   Discharge Diagnoses:  Principal Problem:   Acute CHF [facility]) Active Problems:   Hypertensive urgency   Hypertension   Myocardial injury  Acute combined CHF: echo on 06/23/2017 which showed EF of 50 to 55%.  Repeat echo shows EF 40-45%, grade I diastolic dysfunction, LV demonstrates global hypokinesis, mild MR & no atrial shunts detected. Continue on lasix and po aldactone, losartan, metoprolol & amlodipine as per cardio. Monitor I/Os & daily weights. CTA chest neg for PE, but showed moderate pulmonary edema.    Hypertensive urgency: urgency resolved but still w/ HTN. Continue on losartan, metoprolol, aldactone & amlodipine as per cardio   Likely AKI: Cr is trending up slightly from day prior. Likely from lasix use    Elevated troponin: minimal. Likely secondary to demand ischemia from acute CHF.   Obesity: BMI 35.1. Complicates overall care & prognosis   Normocytic anemia: H&H are stable. No need for a transfusion currently   Pre-DM: HbA1c 6.1. Received lifestyle modifications counseling   Discharge Instructions  Discharge Instructions     Diet - low sodium heart healthy   Complete by: As directed    Discharge instructions   Complete by: As directed    F/u w/ PCP in 1-2 weeks. F/u w/ cardio, Dr. Juliann Pares, in 1 week   Increase activity slowly   Complete by: As directed       Allergies as of 02/15/2022   No Known Allergies      Medication List     STOP taking these medications    benzonatate 100  MG capsule Commonly known as: Lawyer   lisinopril-hydrochlorothiazide 20-25 MG tablet Commonly known as: ZESTORETIC   naproxen 500 MG tablet Commonly known as: Naprosyn   ondansetron 4 MG disintegrating tablet Commonly known as: Zofran ODT   oxyCODONE 5 MG immediate release tablet Commonly known as: Roxicodone   oxyCODONE-acetaminophen 5-325 MG tablet Commonly known as: PERCOCET/ROXICET       TAKE these  medications    amLODipine 5 MG tablet Commonly known as: NORVASC Take 1 tablet (5 mg total) by mouth 2 (two) times daily. What changed: when to take this   aspirin EC 81 MG tablet Take 81 mg by mouth daily.   brompheniramine-pseudoephedrine-DM 30-2-10 MG/5ML syrup Take 5 mLs by mouth 4 (four) times daily as needed.   furosemide 40 MG tablet Commonly known as: Lasix Take 1 tablet (40 mg total) by mouth daily.   losartan 100 MG tablet Commonly known as: COZAAR Take 1 tablet (100 mg total) by mouth daily. Start taking on: February 16, 2022   metoprolol tartrate 100 MG tablet Commonly known as: LOPRESSOR Take 1 tablet (100 mg total) by mouth 2 (two) times daily.   spironolactone 25 MG tablet Commonly known as: ALDACTONE Take 1 tablet (25 mg total) by mouth daily. Start taking on: February 16, 2022        Follow-up Information     Alwyn Pea, MD Follow up.   Specialties: Cardiology, Internal Medicine Why: F/u in 1 week Contact information: [address] [place] [address] [phone]         Hillery Aldo, MD Follow up.   Specialty: Family Medicine Why: F/u in 1-2 weeks Contact information: [address] [place] [address] [phone]                No Known Allergies  Consultations: Cardio    Procedures/Studies: ECHOCARDIOGRAM COMPLETE  Result Date: 02/13/2022    ECHOCARDIOGRAM REPORT   Patient Name:   Sara Reed Date of Exam: 02/13/2022 Medical Rec #:  [id number]       Height:       66.0 in Accession #:    [id number]      Weight:       218.0 lb Date of Birth:  12-03-1960       BSA:          2.074 m Patient Age:    61 years        BP:           189/114 mmHg Patient Gender: F               HR:           104 bpm. Exam Location:  [facility] Procedure: 2D Echo, Cardiac Doppler and Color Doppler Indications:     CHF-acute diastolic I50.31  History:         Patient has prior history of Echocardiogram examinations, most                   recent 06/23/2017. Risk Factors:Hypertension.  Sonographer:     Cristela Blue Referring Phys:  [id number] Lorretta Harp Diagnosing Phys: Alwyn Pea MD  Sonographer Comments: Suboptimal apical window. IMPRESSIONS  1. Left ventricular ejection fraction, by estimation, is 40 to 45%. The left ventricle has mildly decreased function. The left ventricle demonstrates global hypokinesis. There is mild concentric left ventricular hypertrophy. Left ventricular diastolic parameters are consistent with Grade I diastolic dysfunction (impaired relaxation).  2. Right  ventricular systolic function is normal. The right ventricular size is normal.  3. The mitral valve is normal in structure. Mild mitral valve regurgitation.  4. The aortic valve is normal in structure. Aortic valve regurgitation is not visualized. FINDINGS  Left Ventricle: Left ventricular ejection fraction, by estimation, is 40 to 45%. The left ventricle has mildly decreased function. The left ventricle demonstrates global hypokinesis. The left ventricular internal cavity size was normal in size. There is  mild concentric left ventricular hypertrophy. Left ventricular diastolic parameters are consistent with Grade I diastolic dysfunction (impaired relaxation). Right Ventricle: The right ventricular size is normal. No increase in right ventricular wall thickness. Right ventricular systolic function is normal. Left Atrium: Left atrial size was normal in size. Right Atrium: Right atrial size was normal in size. Pericardium: There is no evidence of pericardial effusion. Mitral Valve: The mitral valve is normal in structure. Mild mitral valve regurgitation. Tricuspid Valve: The tricuspid valve is normal in structure. Tricuspid valve regurgitation is mild. Aortic Valve: The aortic valve is normal in structure. Aortic valve regurgitation is not visualized. Aortic valve mean gradient measures 10.0 mmHg. Aortic valve peak gradient measures 15.7 mmHg. Aortic valve area,  by VTI measures 3.13 cm. Pulmonic Valve: The pulmonic valve was normal in structure. Pulmonic valve regurgitation is not visualized. Aorta: The ascending aorta was not well visualized. IAS/Shunts: No atrial level shunt detected by color flow Doppler.  LEFT VENTRICLE PLAX 2D LVIDd:         4.00 cm     Diastology LVIDs:         3.20 cm     LV e' medial:    12.80 cm/s LV PW:         1.40 cm     LV E/e' medial:  12.9 LV IVS:        1.30 cm     LV e' lateral:   14.00 cm/s LVOT diam:     2.00 cm     LV E/e' lateral: 11.8 LV SV:         101 LV SV Index:   49 LVOT Area:     3.14 cm  LV Volumes (MOD) LV vol d, MOD A2C: 76.5 ml LV vol d, MOD A4C: 96.7 ml LV vol s, MOD A2C: 29.0 ml LV vol s, MOD A4C: 67.2 ml LV SV MOD A2C:     47.5 ml LV SV MOD A4C:     96.7 ml LV SV MOD BP:      38.4 ml RIGHT VENTRICLE RV Basal diam:  2.40 cm RV Mid diam:    2.00 cm LEFT ATRIUM             Index        RIGHT ATRIUM           Index LA diam:        3.20 cm 1.54 cm/m   RA Area:     10.60 cm LA Vol (A2C):   65.0 ml 31.33 ml/m  RA Volume:   17.00 ml  8.20 ml/m LA Vol (A4C):   42.4 ml 20.44 ml/m LA Biplane Vol: 52.0 ml 25.07 ml/m  AORTIC VALVE AV Area (Vmax):    3.24 cm AV Area (Vmean):   2.86 cm AV Area (VTI):     3.13 cm AV Vmax:           198.00 cm/s AV Vmean:          146.000 cm/s AV VTI:  0.324 m AV Peak Grad:      15.7 mmHg AV Mean Grad:      10.0 mmHg LVOT Vmax:         204.00 cm/s LVOT Vmean:        133.000 cm/s LVOT VTI:          0.323 m LVOT/AV VTI ratio: 1.00  AORTA Ao Root diam: 3.10 cm MITRAL VALVE                TRICUSPID VALVE MV Area (PHT): 7.16 cm     TR Peak grad:   16.8 mmHg MV Decel Time: 106 msec     TR Vmax:        205.00 cm/s MV E velocity: 165.00 cm/s                             SHUNTS                             Systemic VTI:  0.32 m                             Systemic Diam: 2.00 cm Alwyn Pea MD Electronically signed by Alwyn Pea MD Signature Date/Time: 02/13/2022/4:16:58 PM    Final     CT Angio Chest PE W and/or Wo Contrast  Result Date: 02/13/2022 CLINICAL DATA:  Shortness of breath EXAM: CT ANGIOGRAPHY CHEST WITH CONTRAST TECHNIQUE: Multidetector CT imaging of the chest was performed using the standard protocol during bolus administration of intravenous contrast. Multiplanar CT image reconstructions and MIPs were obtained to evaluate the vascular anatomy. RADIATION DOSE REDUCTION: This exam was performed according to the departmental dose-optimization program which includes automated exposure control, adjustment of the mA and/or kV according to patient size and/or use of iterative reconstruction technique. CONTRAST:  75mL OMNIPAQUE IOHEXOL 350 MG/ML SOLN COMPARISON:  CT chest angio dated June 22, 2017 FINDINGS: Cardiovascular: No evidence of pulmonary embolus. Heart is upper limits of normal in size. No pericardial effusion. Normal caliber thoracic aorta with mild atherosclerotic disease. Visible coronary artery calcifications. Mediastinum/Nodes: Small hiatal hernia. Thyroid is unremarkable. Calcified mediastinal and right hilar lymph nodes, likely sequela of prior granulomatous infection. Mildly enlarged mediastinal lymph nodes, likely reactive, reference subcarinal lymph node measuring 1.4 cm in short axis on series 13, image 47. Lungs/Pleura: Central airways are patent. Bronchovascular bundle thickening and smooth interlobular septal thickening which is most pronounced in the lower lungs. Mild diffuse ground-glass opacities. Bibasilar atelectasis and trace bilateral pleural effusions. Upper Abdomen: No acute abnormality. Musculoskeletal: No chest wall abnormality. No acute or significant osseous findings. Review of the MIP images confirms the above findings. IMPRESSION: 1. No evidence of pulmonary embolus. 2. Moderate pulmonary edema and trace bilateral pleural effusions. 3. Borderline cardiomegaly. 4. Mildly enlarged mediastinal lymph nodes, likely reactive. 5. Aortic  atherosclerosis (ICD10-I70.0). Electronically Signed   By: Allegra Lai M.D.   On: 02/13/2022 09:50   DG Chest 2 View  Result Date: 02/10/2022 CLINICAL DATA:  Cough and shortness of breath EXAM: CHEST - 2 VIEW COMPARISON:  02/01/2022 FINDINGS: Cardiac shadow is stable. Lungs are well aerated bilaterally. Increasing basilar opacities are noted when compared with the prior study consistent with progressive infection. No bony abnormality is noted. IMPRESSION: Progressive basilar infiltrates. Electronically Signed   By: Alcide Clever M.D.   On:  02/10/2022 02:13   DG Chest 2 View  Result Date: 02/01/2022 CLINICAL DATA:  Shortness of breath EXAM: CHEST - 2 VIEW COMPARISON:  None Available. FINDINGS: The heart size and mediastinal contours are within normal limits. Right lower lobe heterogeneous pulmonary opacity. No large pleural effusion or pneumothorax. No acute osseous abnormality. The visualized upper abdomen is unremarkable. IMPRESSION: Right lower lobe heterogeneous pulmonary opacity, raising concern for aspiration or infection. Electronically Signed   By: Jacob MooresMeghana  Konanur M.D.   On: 02/01/2022 16:44   (Echo, Carotid, EGD, Colonoscopy, ERCP)    Subjective: Pt denies any complaints    Discharge Exam: Vitals:   02/15/22 0907 02/15/22 1104  BP: (!) 158/88 (!) 148/91  Pulse: 76 69  Resp: 18 16  Temp: 98.7 F (37.1 C)   SpO2: 98% 100%   Vitals:   02/15/22 0300 02/15/22 0500 02/15/22 0907 02/15/22 1104  BP: (!) 156/99 (!) 152/102 (!) 158/88 (!) 148/91  Pulse: 64 65 76 69  Resp: 12 13 18 16   Temp:  98.2 F (36.8 C) 98.7 F (37.1 C)   TempSrc:  Oral Oral   SpO2: 97% 94% 98% 100%  Weight:      Height:        General: Pt is alert, awake, not in acute distress Cardiovascular:  S1/S2 +, no rubs, no gallops Respiratory: decreased breath sounds b/l  Abdominal: Soft, NT, obese, bowel sounds + Extremities:  no cyanosis    The results of significant diagnostics from this  hospitalization (including imaging, microbiology, ancillary and laboratory) are listed below for reference.     Microbiology: No results found for this or any previous visit (from the past 240 hour(s)).   Labs: BNP (last 3 results) Recent Labs    02/13/22 0744  BNP 189.4*   Basic Metabolic Panel: Recent Labs  Lab 02/10/22 0149 02/13/22 0744 02/14/22 0419 02/15/22 0449  NA 138 137 138 139  K 3.5 3.5 3.7 3.8  CL 111 109 108 105  CO2 21* 25 23 29   GLUCOSE 130* 101* 124* 101*  BUN 18 18 28* 34*  CREATININE 1.03* 1.05* 1.17* 1.29*  CALCIUM 8.9 9.0 9.2 8.6*  MG  --   --  2.2  --    Liver Function Tests: No results for input(s): "AST", "ALT", "ALKPHOS", "BILITOT", "PROT", "ALBUMIN" in the last 168 hours. No results for input(s): "LIPASE", "AMYLASE" in the last 168 hours. No results for input(s): "AMMONIA" in the last 168 hours. CBC: Recent Labs  Lab 02/10/22 0149 02/13/22 0744 02/15/22 0449  WBC 7.7 6.5 12.4*  NEUTROABS 3.8 3.1  --   HGB 10.5* 10.6* 11.4*  HCT 33.5* 34.0* 35.5*  MCV 91.5 91.9 89.6  PLT 362 371 372   Cardiac Enzymes: No results for input(s): "CKTOTAL", "CKMB", "CKMBINDEX", "TROPONINI" in the last 168 hours. BNP: Invalid input(s): "POCBNP" CBG: No results for input(s): "GLUCAP" in the last 168 hours. D-Dimer No results for input(s): "DDIMER" in the last 72 hours. Hgb A1c Recent Labs    02/14/22 0419  HGBA1C 6.1*   Lipid Profile Recent Labs    02/13/22 1031  CHOL 240*  HDL 62  LDLCALC 161*  TRIG 86  CHOLHDL 3.9   Thyroid function studies No results for input(s): "TSH", "T4TOTAL", "T3FREE", "THYROIDAB" in the last 72 hours.  Invalid input(s): "FREET3" Anemia work up No results for input(s): "VITAMINB12", "FOLATE", "FERRITIN", "TIBC", "IRON", "RETICCTPCT" in the last 72 hours. Urinalysis    Component Value Date/Time   COLORURINE COLORLESS (A)  06/22/2017 1117   APPEARANCEUR CLEAR (A) 06/22/2017 1117   APPEARANCEUR Clear 11/10/2013  1947   LABSPEC 1.025 06/22/2017 1117   LABSPEC 1.015 11/10/2013 1947   PHURINE 7.0 06/22/2017 1117   GLUCOSEU NEGATIVE 06/22/2017 1117   GLUCOSEU Negative 11/10/2013 1947   HGBUR NEGATIVE 06/22/2017 1117   BILIRUBINUR NEGATIVE 06/22/2017 1117   BILIRUBINUR Negative 11/10/2013 1947   KETONESUR NEGATIVE 06/22/2017 1117   PROTEINUR NEGATIVE 06/22/2017 1117   NITRITE NEGATIVE 06/22/2017 1117   LEUKOCYTESUR NEGATIVE 06/22/2017 1117   LEUKOCYTESUR Negative 11/10/2013 1947   Sepsis Labs Recent Labs  Lab 02/10/22 0149 02/13/22 0744 02/15/22 0449  WBC 7.7 6.5 12.4*   Microbiology No results found for this or any previous visit (from the past 240 hour(s)).   Time coordinating discharge: Over 30 minutes  SIGNED:   Charise Killian, MD  [facility] 02/15/2022, 3:29 PM Pager   If 7PM-7AM, please contact night-coverage [email or web address]

## 2022-02-15 NOTE — Progress Notes (Signed)
  Heart Failure Nurse Navigator Note  Met with patient today, she has been transferred to the progressive unit.  He is currently on room air states that her breathing has improved.  States that she has read through the heart failure education materials and at this time did not have any questions.  Also discussed caution when drinking Gatorade as it does contain sodium.  She voices understanding.  She was given an automatic blood pressure machine.  Given instructions on how to take her blood pressure.  He had no further questions.  Pricilla Riffle RN CHFN

## 2022-02-15 NOTE — Progress Notes (Signed)
[facility]  Patient Description: Sara Reed is a pleasant 61 y/o female with PMH significant for HTN and obesity who was admitted for new-onset acute HFrEF with systolic and diastolic dysfunction.   SUBJECTIVE: The patient is feeling well on today. Her previous symptoms of dyspnea have completely resolved at this time.  She denies having any chest pain, palpitations, peripheral edema or syncope at this time.  She has been tolerating the recent medication changes well and denies having any issues with them at this time.    OBJECTIVE:   Vitals:   02/15/22 0300 02/15/22 0500 02/15/22 0907 02/15/22 1104  BP: (!) 156/99 (!) 152/102 (!) 158/88 (!) 148/91  Pulse: 64 65 76 69  Resp: 12 13 18 16   Temp:  98.2 F (36.8 C) 98.7 F (37.1 C)   TempSrc:  Oral Oral   SpO2: 97% 94% 98% 100%  Weight:      Height:        No intake or output data in the 24 hours ending 02/15/22 1619    PHYSICAL EXAM  General: Well developed, well nourished, in no acute distress HEENT:  Normocephalic and atraumatic. PERRL Neck:  No JVD.  Negative for bruit. Lungs: Clear bilaterally to auscultation.  Chest expansion symmetrical.  No wheezes, rales or rhonchi or crackles. Heart: HRRR . Normal S1 and S2 without gallops or murmurs.  Abdomen: Bowel sounds are positive, abdomen soft and non-tender  Msk:  Back normal. Normal strength and tone for age. Extremities: No clubbing, cyanosis or edema.   Neuro: Alert and oriented X 3. Psych:  Good affect, responds appropriately   LABS: Basic Metabolic Panel: Recent Labs    02/14/22 0419 02/15/22 0449  NA 138 139  K 3.7 3.8  CL 108 105  CO2 23 29  GLUCOSE 124* 101*  BUN 28* 34*  CREATININE 1.17* 1.29*  CALCIUM 9.2 8.6*  MG 2.2  --    Liver Function Tests: No results for input(s): "AST", "ALT", "ALKPHOS", "BILITOT", "PROT", "ALBUMIN" in the last 72 hours. No results for input(s): "LIPASE", "AMYLASE" in the last 72 hours. CBC: Recent Labs    02/13/22 0744  02/15/22 0449  WBC 6.5 12.4*  NEUTROABS 3.1  --   HGB 10.6* 11.4*  HCT 34.0* 35.5*  MCV 91.9 89.6  PLT 371 372   Cardiac Enzymes: No results for input(s): "CKTOTAL", "CKMB", "CKMBINDEX", "TROPONINI" in the last 72 hours. BNP: Invalid input(s): "POCBNP" D-Dimer: No results for input(s): "DDIMER" in the last 72 hours. Hemoglobin A1C: Recent Labs    02/14/22 0419  HGBA1C 6.1*   Fasting Lipid Panel: Recent Labs    02/13/22 1031  CHOL 240*  HDL 62  LDLCALC 161*  TRIG 86  CHOLHDL 3.9   Thyroid Function Tests: No results for input(s): "TSH", "T4TOTAL", "T3FREE", "THYROIDAB" in the last 72 hours.  Invalid input(s): "FREET3" Anemia Panel: No results for input(s): "VITAMINB12", "FOLATE", "FERRITIN", "TIBC", "IRON", "RETICCTPCT" in the last 72 hours.  No results found.   Echo: (02/13/2022) IMPRESSIONS   1. Left ventricular ejection fraction, by estimation, is 40 to 45%. The  left ventricle has mildly decreased function. The left ventricle  demonstrates global hypokinesis. There is mild concentric left ventricular  hypertrophy. Left ventricular diastolic  parameters are consistent with Grade I diastolic dysfunction (impaired  relaxation).   2. Right ventricular systolic function is normal. The right ventricular  size is normal.   3. The mitral valve is normal in structure. Mild mitral valve  regurgitation.  4. The aortic valve is normal in structure. Aortic valve regurgitation is  not visualized.   TELEMETRY: SR with HR of 71 bpm  ASSESSMENT AND PLAN:  Principal Problem:   Acute CHF ([place]) Active Problems:   Hypertension   Myocardial injury   Hypertensive urgency    #New onset HFrEF #Hypertensive urgency # AKI, reasonably stable  Initial BNP was slightly elevated at 189.4. HS troponin slightly elevated at 20 >>18 likely secondary to demand ischemia. The patient is without any anginal symptoms on today.  The echocardiogram revealed LV systolic dysfunction  with estimated EF of 40-45% and grade 1 diastolic dysfunction which is new for the patient in comparison to her echocardiogram from 2019 which revealed normal LV systolic function with estimated EF of 50 to 55%. This is  likely secondary to poorly controlled HTN. At home medication compliance may be an issue?  CT angio of the chest also revealed moderate pulmonary edema and trace bilateral pleural effusions with borderline cardiomegaly. The patient is asymptomatic at this time. Her initial creatinine was slightly elevated at 1.06 with a BUN of 18 which has now trended upward to a creatinine of 1.29 and BUN of 34 likely secondary to IV diuresis. Her blood pressure have significantly improved to systolic of 179 and she has been tolerating the current regimen well.  - transition to Lasix 40 mg daily upon discharge.  -Continue losartan 100mg  once daily.  -Continue amlodipine 5 mg by mouth twice daily.  -Continue metoprolol tartrate 100 mg by mouth twice daily.  -Continue spironolactone 25 mg by mouth once daily.  -Daily weights, strict I's and O's, heart healthy diet.  -Consider outpatient stress test due to global hypokinesis noted on echocardiogram.   -Consider renal artery ultrasound as outpatient to rule out renal artery stenosis.  - continue to trend bmp.   -- The patient is cleared from a cardiac standpoint for discharge.  The patient should follow-up with cardiology with Dr. Gemma Payor as outpatient 1 week postdischarge.    [place], [id number]-AG  02/15/2022 4:19 PM

## 2022-02-19 ENCOUNTER — Telehealth: Payer: Self-pay

## 2022-02-19 NOTE — Telephone Encounter (Signed)
   Heart Failure Nurse Navigator Note  Received of message that patient had been trying to reach me.  Called and spoke with her at [phone].  She states that she is doing well her weight is remaining steady, and her blood pressure readings are good.  States that she has remained off work for these 2 days just to rest but that the doctors had not given her a date when to return to work.  She is requesting some information on eating healthy.  Told her that I would supply her with a heart healthy-reduced sodium handout.  We will leave it at the front desk of the heart failure clinic.  She is also asking about the thousand dollars grant as she was given a blood pressure cuff from [facility] with that grant.  She states that she is needing help with assistance with her rent.  I told her that I would speak with her social worker that had seen her in the hospital and would get back with her.  She voices understanding.  She states that she plans on making her appointment with Dr. Towanda Malkin today.  He is to be seen in the outpatient heart failure clinic on 1030 at 8:30 in the morning.  She had no further questions.

## 2022-02-20 ENCOUNTER — Telehealth: Payer: Self-pay

## 2022-02-20 NOTE — Telephone Encounter (Addendum)
Heart Failure Nurse Navigator Note  Returned call to patient as she had questions about the form she signed when given the scale that states she might qualify for a grant.  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  She voices understanding.  She came too late yesterday to pick up her info on sodium in the diet.  She plans to come earlier today.  Pricilla Riffle RN CHFN

## 2022-02-22 NOTE — Progress Notes (Deleted)
   Patient ID: Sara Reed, female    DOB: [birth year], 61 y.o.   MRN: [id number]  HPI  Sara Reed is a 61 y/o female with a history of  Echo report from 02/13/22 reviewed and showed an EF of 40-45% along with mild LVH and mild MR.   Admitted 02/13/22 due to shortness of breath due to acute HF. Cardiology consult obtained. Treated w/ lasix, metoprolol, aldactone, losartan & amlodipine as per cardio. CTA chest neg for PE, but showed moderate pulmonary edema. Elevated troponin thought to be due to demand ischemia. Discharged after 2 days. Was in the ED 02/10/22 due to bronchitis where she was treated and released. Was in the ED 02/01/22 due to pneumonia where she was treated and released.   She presents today for her initial visit with a chief complaint of   Review of Systems    Physical Exam  Assessment & Plan:  1: Chronic heart failure with reduced ejection fraction- - NYHA class  - on GDMT - BNP 02/13/22 was 189.4  2: HTN-  - BP - sees PCP Sara Reed) @  - BMP 02/15/22 reviewed and showed sodium 139, potassium 3.8, creatinine 1.29 & GFR 48  3: Anemia- - hemoglobin 02/15/22 was 11.4

## 2022-02-25 ENCOUNTER — Telehealth: Payer: Self-pay | Admitting: Family

## 2022-02-25 ENCOUNTER — Ambulatory Visit: Payer: Medicaid Other | Admitting: Family

## 2022-02-25 NOTE — Telephone Encounter (Signed)
Patient did not show for her initial [facility] Failure [facility] appointment on 02/25/22. Will attempt to reschedule.

## 2022-02-26 ENCOUNTER — Telehealth: Payer: Self-pay

## 2022-02-26 NOTE — Telephone Encounter (Signed)
Heart Failure Nurse Navigator Note  Spoke with Colan Neptune regarding grant money for rent for this patient.  She was discharged and now is stating the hospitalization put a financial strain on her.  Juliann Pulse explained that the patient has to be an inpatient to qualify.  Call to patient to make her aware.  She voices understanding.  Pricilla Riffle RN [facility]

## 2022-03-06 ENCOUNTER — Ambulatory Visit: Payer: Medicaid Other | Admitting: Family

## 2022-03-07 NOTE — Progress Notes (Signed)
Patient ID: Sara Reed, female    DOB: February 02, 1961, 61 y.o.   MRN: [id number]  HPI  Ms Buchner is a 61 y/o female with a history of HTN, anemia and chronic heart failure.   Echo report from 02/13/22 reviewed and showed an EF of 40-45% along with mild LVH and mild MR.   Admitted 02/13/22 due to shortness of breath due to acute HF. Cardiology consult obtained. Treated w/ lasix, metoprolol, aldactone, losartan & amlodipine as per cardio. CTA chest neg for PE, but showed moderate pulmonary edema. Elevated troponin thought to be due to demand ischemia. Discharged after 2 days. Was in the ED 02/10/22 due to bronchitis where she was treated and released. Was in the ED 02/01/22 due to pneumonia where she was treated and released.   She presents today for her initial visit with a chief complaint of dizziness after taking metoprolol. Describes this as having been present since her recent admission. She denies any difficulty sleeping, abdominal distention, palpitations, pedal edema, chest pain, shortness of breath, cough or fatigue.   Not weighing daily but does have scales.   Past Medical History:  Diagnosis Date   Hypertension    Past Surgical History:  Procedure Laterality Date   ORIF ANKLE FRACTURE Right 06/04/2019   Procedure: OPEN REDUCTION INTERNAL FIXATION WITH DISTAL FIBULA PLATE AND SYNDESMOTIC REPAIR OF RIGHT ANKLE.;  Surgeon: Donato Heinz, MD;  Location: [facility] ORS;  Service: Orthopedics;  Laterality: Right;   Family History  Problem Relation Age of Onset   Breast cancer Neg Hx    Social History   Tobacco Use   Smoking status: Never   Smokeless tobacco: Never  Substance Use Topics   Alcohol use: No   No Known Allergies  Prior to Admission medications   Medication Sig Start Date End Date Taking? Authorizing Provider  amLODipine (NORVASC) 5 MG tablet Take 1 tablet (5 mg total) by mouth 2 (two) times daily. 02/15/22 03/17/22 Yes Charise Killian, MD  aspirin EC 81 MG tablet  Take 81 mg by mouth daily.   Yes [provider]  furosemide (LASIX) 40 MG tablet Take 1 tablet (40 mg total) by mouth daily. 02/15/22 03/17/22 Yes Charise Killian, MD  losartan (COZAAR) 100 MG tablet Take 1 tablet (100 mg total) by mouth daily. 02/16/22 03/18/22 Yes Charise Killian, MD  metoprolol tartrate (LOPRESSOR) 100 MG tablet Take 1 tablet (100 mg total) by mouth 2 (two) times daily. 02/15/22 03/17/22 Yes Charise Killian, MD  spironolactone (ALDACTONE) 25 MG tablet Take 1 tablet (25 mg total) by mouth daily. 02/16/22 03/18/22 Yes Charise Killian, MD    Review of Systems  Constitutional:  Negative for appetite change and fatigue.  HENT:  Negative for congestion, postnasal drip and sore throat.   Eyes: Negative.   Respiratory:  Negative for cough, shortness of breath and wheezing.   Cardiovascular:  Negative for chest pain, palpitations and leg swelling.  Gastrointestinal:  Negative for abdominal distention and abdominal pain.  Endocrine: Negative.   Genitourinary: Negative.   Musculoskeletal:  Negative for back pain and neck pain.  Skin: Negative.   Allergic/Immunologic: Negative.   Neurological:  Positive for dizziness (with metoprolol). Negative for light-headedness.  Hematological:  Negative for adenopathy. Does not bruise/bleed easily.  Psychiatric/Behavioral:  Negative for dysphoric mood and sleep disturbance (sleeping on 1 pillow). The patient is not nervous/anxious.    Vitals:   03/08/22 1041  BP: 131/82  Pulse: (!) 59  Resp:  20  SpO2: 100%  Weight: 212 lb 2 oz (96.2 kg)  Height: 5\' 4"  (1.626 m)   Wt Readings from Last 3 Encounters:  03/08/22 212 lb 2 oz (96.2 kg)  02/13/22 218 lb (98.9 kg)  02/10/22 216 lb (98 kg)   Lab Results  Component Value Date   CREATININE 1.35 (H) 03/08/2022   CREATININE 1.29 (H) 02/15/2022   CREATININE 1.17 (H) 02/14/2022   Physical Exam Vitals and nursing note reviewed.  Constitutional:      Appearance:  Normal appearance.  HENT:     Head: Normocephalic and atraumatic.  Cardiovascular:     Rate and Rhythm: Regular rhythm. Bradycardia present.  Pulmonary:     Effort: Pulmonary effort is normal. No respiratory distress.     Breath sounds: No wheezing or rales.  Abdominal:     General: There is no distension.     Palpations: Abdomen is soft.     Tenderness: There is no abdominal tenderness.  Musculoskeletal:        General: No tenderness.     Cervical back: Normal range of motion and neck supple.     Right lower leg: No edema.     Left lower leg: No edema.  Skin:    General: Skin is warm and dry.  Neurological:     General: No focal deficit present.     Mental Status: She is alert and oriented to person, place, and time.  Psychiatric:        Mood and Affect: Mood normal.        Behavior: Behavior normal.        Thought Content: Thought content normal.   Assessment & Plan:  1: Chronic heart failure with reduced ejection fraction- - NYHA class II - euvolemic - not weighing daily but does have working scales; instructed to weigh daily and call for an overnight weight gain of > 2 pounds or a weekly weight gain of > 5 pounds - not adding salt to her food and has been reading food labels for sodium content; understands to keep daily sodium intake to < 2000mg  - on GDMT of losartan, metoprolol tartrate and spironolactone - will change metoprolol from tartrate to succinate and reduce the dose to 100mg  daily - plan to start jardiance at next visit but will probably need patient assistance for this - BNP 02/13/22 was 189.4 - does not take the flu vaccine  2: HTN-  - BP looks good (131/82) - sees PCP ) @ and she has to call and make a f/u appointment - BMP 02/15/22 reviewed and showed sodium 139, potassium 3.8, creatinine 1.29 & GFR 48 - will recheck BMP today  3: Anemia- - hemoglobin 02/15/22 was 11.4   Medication bottles reviewed.   Return in 3 weeks, sooner  if needed.

## 2022-03-08 ENCOUNTER — Other Ambulatory Visit
Admission: RE | Admit: 2022-03-08 | Discharge: 2022-03-08 | Disposition: A | Discharge status: Home / Self Care | Payer: Medicaid Other | Source: Ambulatory Visit | Attending: Family | Admitting: Family

## 2022-03-08 ENCOUNTER — Ambulatory Visit: Payer: Medicaid Other | Attending: Family | Admitting: Family

## 2022-03-08 ENCOUNTER — Encounter: Payer: Self-pay | Admitting: Family

## 2022-03-08 VITALS — BP 131/82 | HR 59 | Resp 20 | Ht 64.0 in | Wt 212.1 lb

## 2022-03-08 DIAGNOSIS — I5022 Chronic systolic (congestive) heart failure: Secondary | ICD-10-CM | POA: Insufficient documentation

## 2022-03-08 DIAGNOSIS — D649 Anemia, unspecified: Secondary | ICD-10-CM | POA: Insufficient documentation

## 2022-03-08 DIAGNOSIS — I10 Essential (primary) hypertension: Secondary | ICD-10-CM

## 2022-03-08 DIAGNOSIS — I110 Hypertensive heart disease with heart failure: Secondary | ICD-10-CM | POA: Insufficient documentation

## 2022-03-08 DIAGNOSIS — Z79899 Other long term (current) drug therapy: Secondary | ICD-10-CM | POA: Insufficient documentation

## 2022-03-08 LAB — BASIC METABOLIC PANEL
Anion gap: 7 (ref 5–15)
BUN: 26 mg/dL — ABNORMAL HIGH (ref 6–20)
CO2: 25 mmol/L (ref 22–32)
Calcium: 9.6 mg/dL (ref 8.9–10.3)
Chloride: 106 mmol/L (ref 98–111)
Creatinine, Ser: 1.35 mg/dL — ABNORMAL HIGH (ref 0.44–1.00)
GFR, Estimated: 45 mL/min — ABNORMAL LOW (ref >60)
Glucose, Bld: 99 mg/dL (ref 70–99)
Potassium: 4.1 mmol/L (ref 3.5–5.1)
Sodium: 138 mmol/L (ref 135–145)

## 2022-03-08 MED ORDER — FUROSEMIDE 40 MG PO TABS: 40.0000 mg | ORAL_TABLET | Freq: Every day | ORAL | 5 refills | Status: AC

## 2022-03-08 MED ORDER — LOSARTAN POTASSIUM 100 MG PO TABS: 100.0000 mg | ORAL_TABLET | Freq: Every day | ORAL | 5 refills | Status: DC

## 2022-03-08 MED ORDER — METOPROLOL SUCCINATE ER 100 MG PO TB24: 100.0000 mg | ORAL_TABLET | Freq: Every day | ORAL | 5 refills | Status: DC

## 2022-03-08 MED ORDER — SPIRONOLACTONE 25 MG PO TABS: 25.0000 mg | ORAL_TABLET | Freq: Every day | ORAL | 5 refills | Status: DC

## 2022-03-08 NOTE — Patient Instructions (Addendum)
Begin weighing daily and call for an overnight weight gain of 3 pounds or more or a weekly weight gain of more than 5 pounds.  If you have voicemail, please make sure your mailbox is cleaned out so that we may leave a message and please make sure to listen to any voicemails.   I sent your 4 prescriptions to the [facility] on [address]

## 2022-03-28 ENCOUNTER — Ambulatory Visit: Payer: Medicaid Other | Admitting: Family

## 2022-04-04 ENCOUNTER — Encounter: Payer: Self-pay | Admitting: Family

## 2022-04-12 ENCOUNTER — Encounter: Payer: Self-pay | Admitting: Family

## 2022-06-14 IMAGING — CR DG CHEST 2V
2 series · 2 of 2 positions shown · non-contrast
Comparison: 06/22/2017

CLINICAL DATA: [id number] positive

EXAM:
CHEST - 2 VIEW

[chest pa]
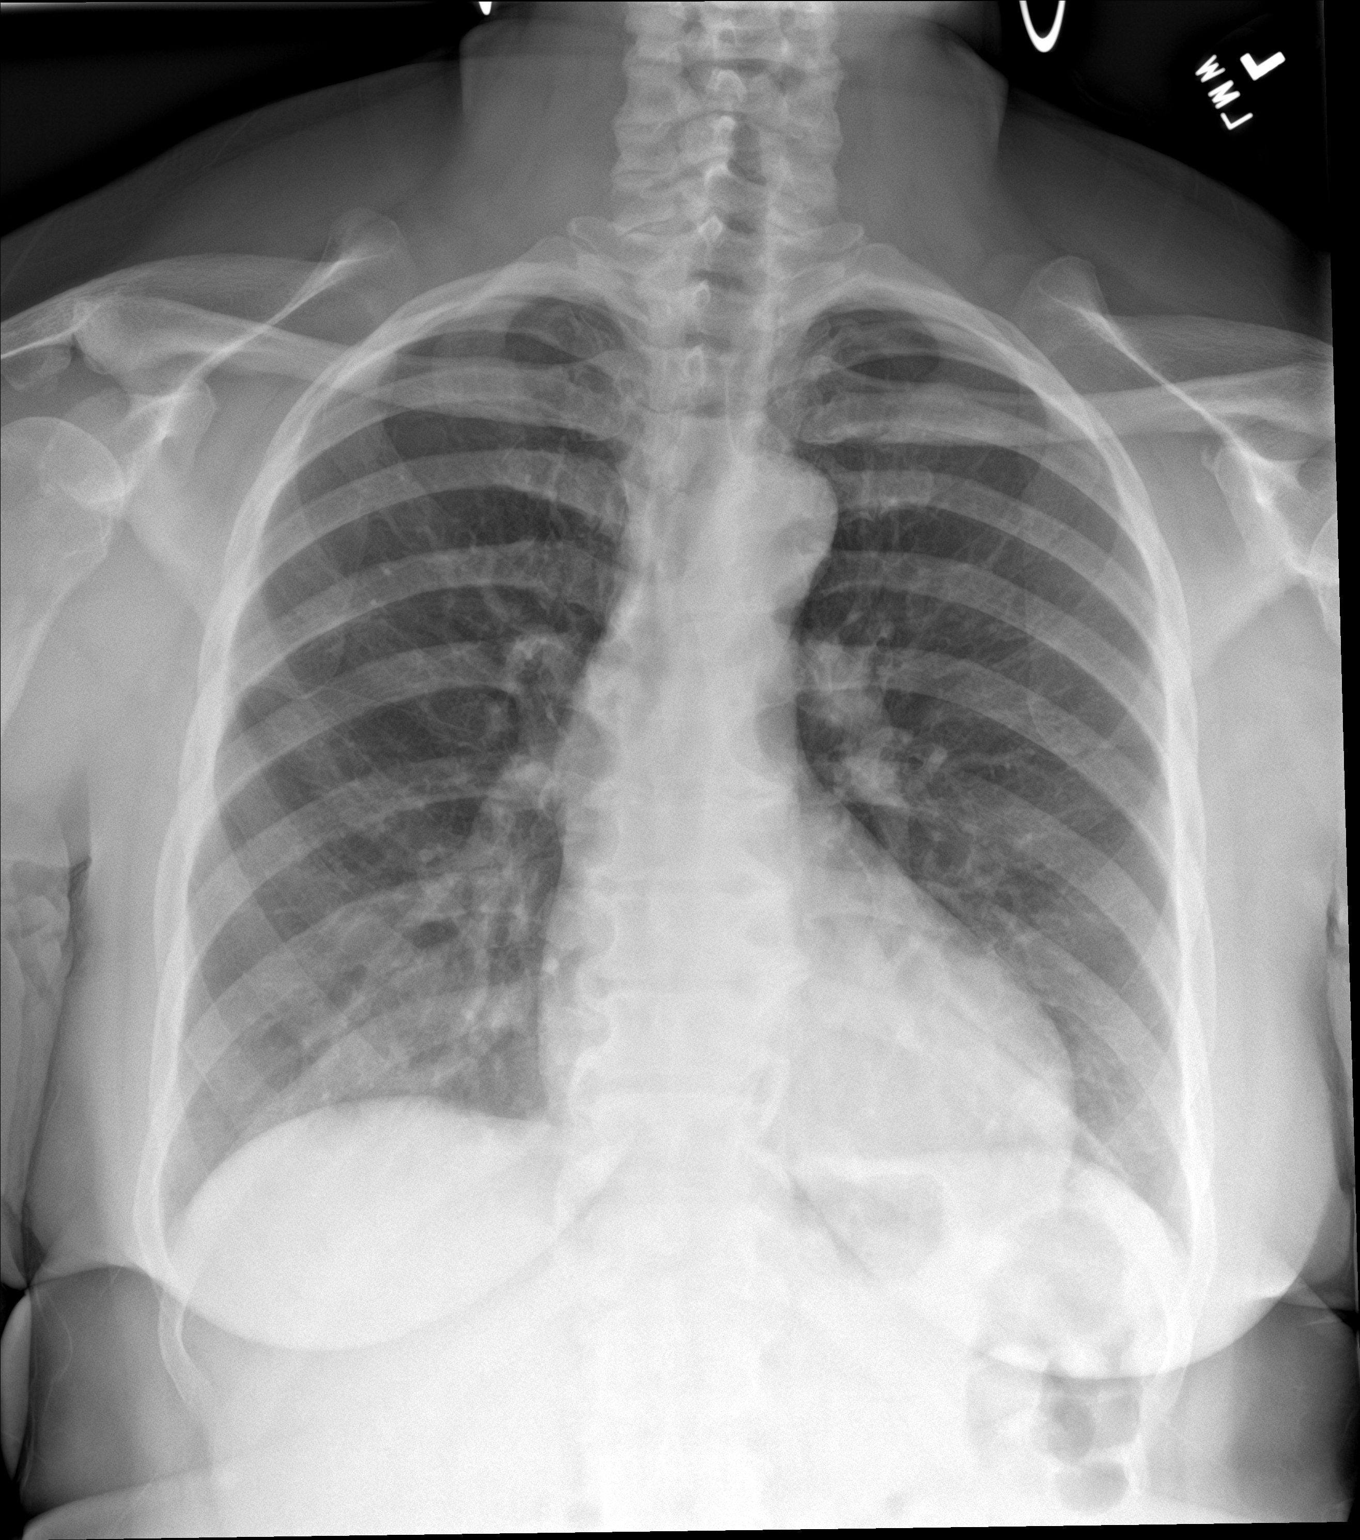

[chest lat]
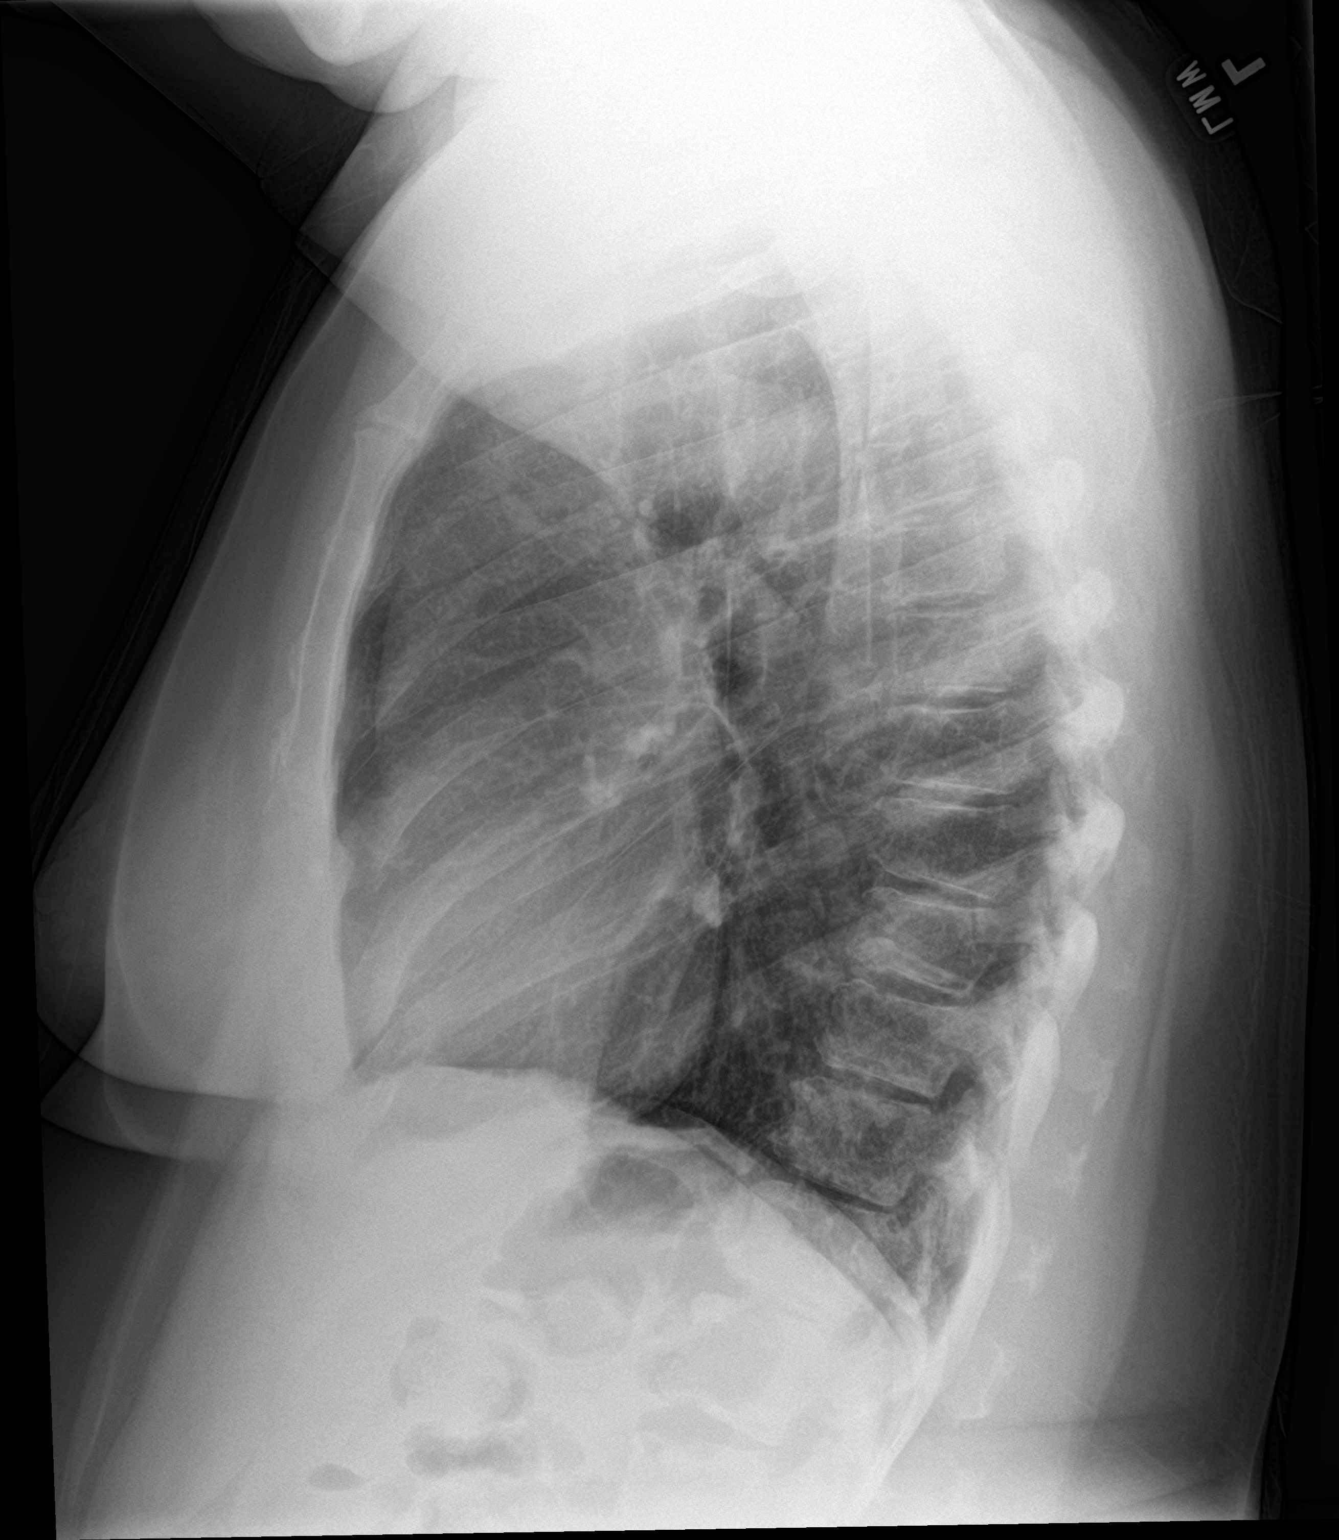

[2 of 2 positions shown; findings below may reference images not displayed]

FINDINGS: Frontal and lateral views of the chest demonstrate an unremarkable
cardiac silhouette. No acute airspace disease, effusion, or
pneumothorax. No acute bony abnormalities.
IMPRESSION: 1. No acute intrathoracic process.

## 2022-09-22 ENCOUNTER — Other Ambulatory Visit: Payer: Self-pay | Admitting: Family

## 2022-09-24 ENCOUNTER — Other Ambulatory Visit: Payer: Self-pay

## 2022-09-24 ENCOUNTER — Emergency Department
Admission: EM | Admit: 2022-09-24 | Discharge: 2022-09-24 | Disposition: A | Discharge status: Home / Self Care | Payer: Medicaid Other | Attending: Emergency Medicine | Admitting: Emergency Medicine

## 2022-09-24 ENCOUNTER — Emergency Department: Payer: Medicaid Other

## 2022-09-24 ENCOUNTER — Encounter: Payer: Self-pay | Admitting: Emergency Medicine

## 2022-09-24 DIAGNOSIS — G44209 Tension-type headache, unspecified, not intractable: Secondary | ICD-10-CM | POA: Insufficient documentation

## 2022-09-24 DIAGNOSIS — I509 Heart failure, unspecified: Secondary | ICD-10-CM | POA: Diagnosis not present

## 2022-09-24 DIAGNOSIS — R519 Headache, unspecified: Secondary | ICD-10-CM | POA: Diagnosis not present

## 2022-09-24 DIAGNOSIS — I110 Hypertensive heart disease with heart failure: Secondary | ICD-10-CM | POA: Diagnosis not present

## 2022-09-24 MED ORDER — BUTALBITAL-APAP-CAFFEINE 50-325-40 MG PO TABS
2.0000 | ORAL_TABLET | Freq: Once | ORAL | Status: AC
  Administered 2022-09-24: 2 via ORAL
  Filled 2022-09-24: qty 2

## 2022-09-24 NOTE — Discharge Instructions (Signed)
Use Tylenol for pain and fevers.  Up to 1000 mg per dose, up to 4 times per day.  Do not take more than 4000 mg of Tylenol/acetaminophen within 24 hours..  

## 2022-09-24 NOTE — ED Provider Notes (Signed)
[facility] Provider Note    Event Date/Time   First MD Initiated Contact with Patient 09/24/22 [phone]     (approximate)   History   Headache   HPI  Sara Reed is a 62 y.o. female who presents to the ED for evaluation of Headache   I reviewed medical DC summary from 01/2022 where she was admitted for new onset CHF.  She is a history of HTN, obesity  Patient presents to the ED for evaluation of a headache.  She reports a tight band around the top of her head over the past few hours since 5 or 6 PM.  Started gradually.  No syncope, falls or trauma.  No fevers or recent illnesses.  No neck stiffness.  No vision changes.  She reports compliance with her multiple BP medications but are concerned might be her blood pressure causing headache as she checked it at home after the headache is started and noted to be fairly high. No vision changes  Physical Exam   Triage Vital Signs: ED Triage Vitals [09/24/22 0057]  Enc Vitals Group     BP (!) 198/108     Pulse Rate 85     Resp 15     Temp 98.3 F (36.8 C)     Temp src      SpO2 96 %     Weight 212 lb (96.2 kg)     Height 5\' 3"  (1.6 m)     Head Circumference      Peak Flow      Pain Score 10     Pain Loc      Pain Edu?      Excl. in GC?     Most recent vital signs: Vitals:   09/24/22 0057 09/24/22 0305  BP: (!) 198/108 (!) 162/83  Pulse: 85 65  Resp: 15 17  Temp: 98.3 F (36.8 C) 97.9 F (36.6 C)  SpO2: 96% 100%    General: Awake, no distress.  Pleasant and conversational, ambulatory, well-appearing.  No meningeal features. CV:  Good peripheral perfusion.  Resp:  Normal effort.  Abd:  No distention.  MSK:  No deformity noted.  Neuro:  No focal deficits appreciated. Cranial nerves II through XII intact 5/5 strength and sensation in all 4 extremities Other:  Bilateral temples without tenderness   ED Results / Procedures / Treatments   Labs (all labs ordered are listed, but only  abnormal results are displayed) Labs Reviewed - No data to display  EKG   RADIOLOGY CT head interpreted by me without evidence of acute intracranial pathology  Official radiology report(s): CT HEAD WO CONTRAST ( )  Result Date: 09/24/2022 CLINICAL DATA:  novel frontal headache EXAM: CT HEAD WITHOUT CONTRAST TECHNIQUE: Contiguous axial images were obtained from the base of the skull through the vertex without intravenous contrast. RADIATION DOSE REDUCTION: This exam was performed according to the departmental dose-optimization program which includes automated exposure control, adjustment of the mA and/or kV according to patient size and/or use of iterative reconstruction technique. COMPARISON:  CT head 11/10/2013 FINDINGS: Brain: Cerebral ventricle sizes are concordant with the degree of cerebral volume loss. No evidence of large-territorial acute infarction. No parenchymal hemorrhage. No mass lesion. No extra-axial collection. No mass effect or midline shift. No hydrocephalus. Basilar cisterns are patent. Vascular: No hyperdense vessel. Skull: No acute fracture or focal lesion. Sinuses/Orbits: Paranasal sinuses and mastoid air cells are clear. The orbits are unremarkable. Other: None. IMPRESSION: No acute intracranial  abnormality. Electronically Signed   By: Tish Frederickson M.D.   On: 09/24/2022 01:47    PROCEDURES and INTERVENTIONS:  Procedures  Medications  butalbital-acetaminophen-caffeine (FIORICET) 50-325-40 MG per tablet 2 tablet (2 tablets Oral Given 09/24/22 0305)     IMPRESSION / MDM / ASSESSMENT AND PLAN / ED COURSE  I reviewed the triage vital signs and the nursing notes.  Differential diagnosis includes, but is not limited to, ICH, stroke, tension headache, migraine, temporal arteritis  {Patient presents with symptoms of an acute illness or injury that is potentially life-threatening.  Patient presents to the ED for evaluation of a headache, resolved with Fioricet with  benign workup and suitable for outpatient management.  She looks well.  No concerning features or meningeal features.  No neurologic deficits.  CT without ICH or other concerning changes.  Resolved with Fioricet.  Suitable for outpatient management.  Clinical Course as of 09/24/22 0352  Tue Sep 24, 2022  0350 Reassessed.  She reports resolution of her headache.  BP improving.  We discussed management of headaches at home and appropriate return precautions.  Answered questions [DS]    Clinical Course User Index [DS] Delton Prairie, MD     FINAL CLINICAL IMPRESSION(S) / ED DIAGNOSES   Final diagnoses:  Bad headache  Tension headache     Rx / DC Orders   ED Discharge Orders     None        Note:  This document was prepared using Dragon voice recognition software and may include unintentional dictation errors.   Delton Prairie, MD 09/24/22 [phone]

## 2022-09-24 NOTE — ED Notes (Signed)
ED Provider at bedside. 

## 2022-09-24 NOTE — ED Triage Notes (Signed)
Patient ambulatory to triage with steady gait, without difficulty or distress noted; pt reports frontal HA since yesterday unrelieved by aspirin; denies accomp symptoms, denies hx of same

## 2022-09-27 ENCOUNTER — Other Ambulatory Visit: Payer: Self-pay | Admitting: Family

## 2022-09-30 DIAGNOSIS — D649 Anemia, unspecified: Secondary | ICD-10-CM | POA: Diagnosis not present

## 2022-09-30 DIAGNOSIS — Q892 Congenital malformations of other endocrine glands: Secondary | ICD-10-CM | POA: Diagnosis not present

## 2022-09-30 DIAGNOSIS — E119 Type 2 diabetes mellitus without complications: Secondary | ICD-10-CM | POA: Diagnosis not present

## 2022-09-30 DIAGNOSIS — I10 Essential (primary) hypertension: Secondary | ICD-10-CM | POA: Diagnosis not present

## 2022-10-21 ENCOUNTER — Other Ambulatory Visit: Payer: Self-pay | Admitting: Family Medicine

## 2022-10-21 DIAGNOSIS — Z1231 Encounter for screening mammogram for malignant neoplasm of breast: Secondary | ICD-10-CM

## 2022-11-04 IMAGING — CR DG CHEST 2V
2 series · 2 of 2 positions shown · non-contrast
Comparison: Chest radiograph dated 01/22/2021.

CLINICAL DATA: Parenchyma.

EXAM:
CHEST - 2 VIEW

[chest pa]
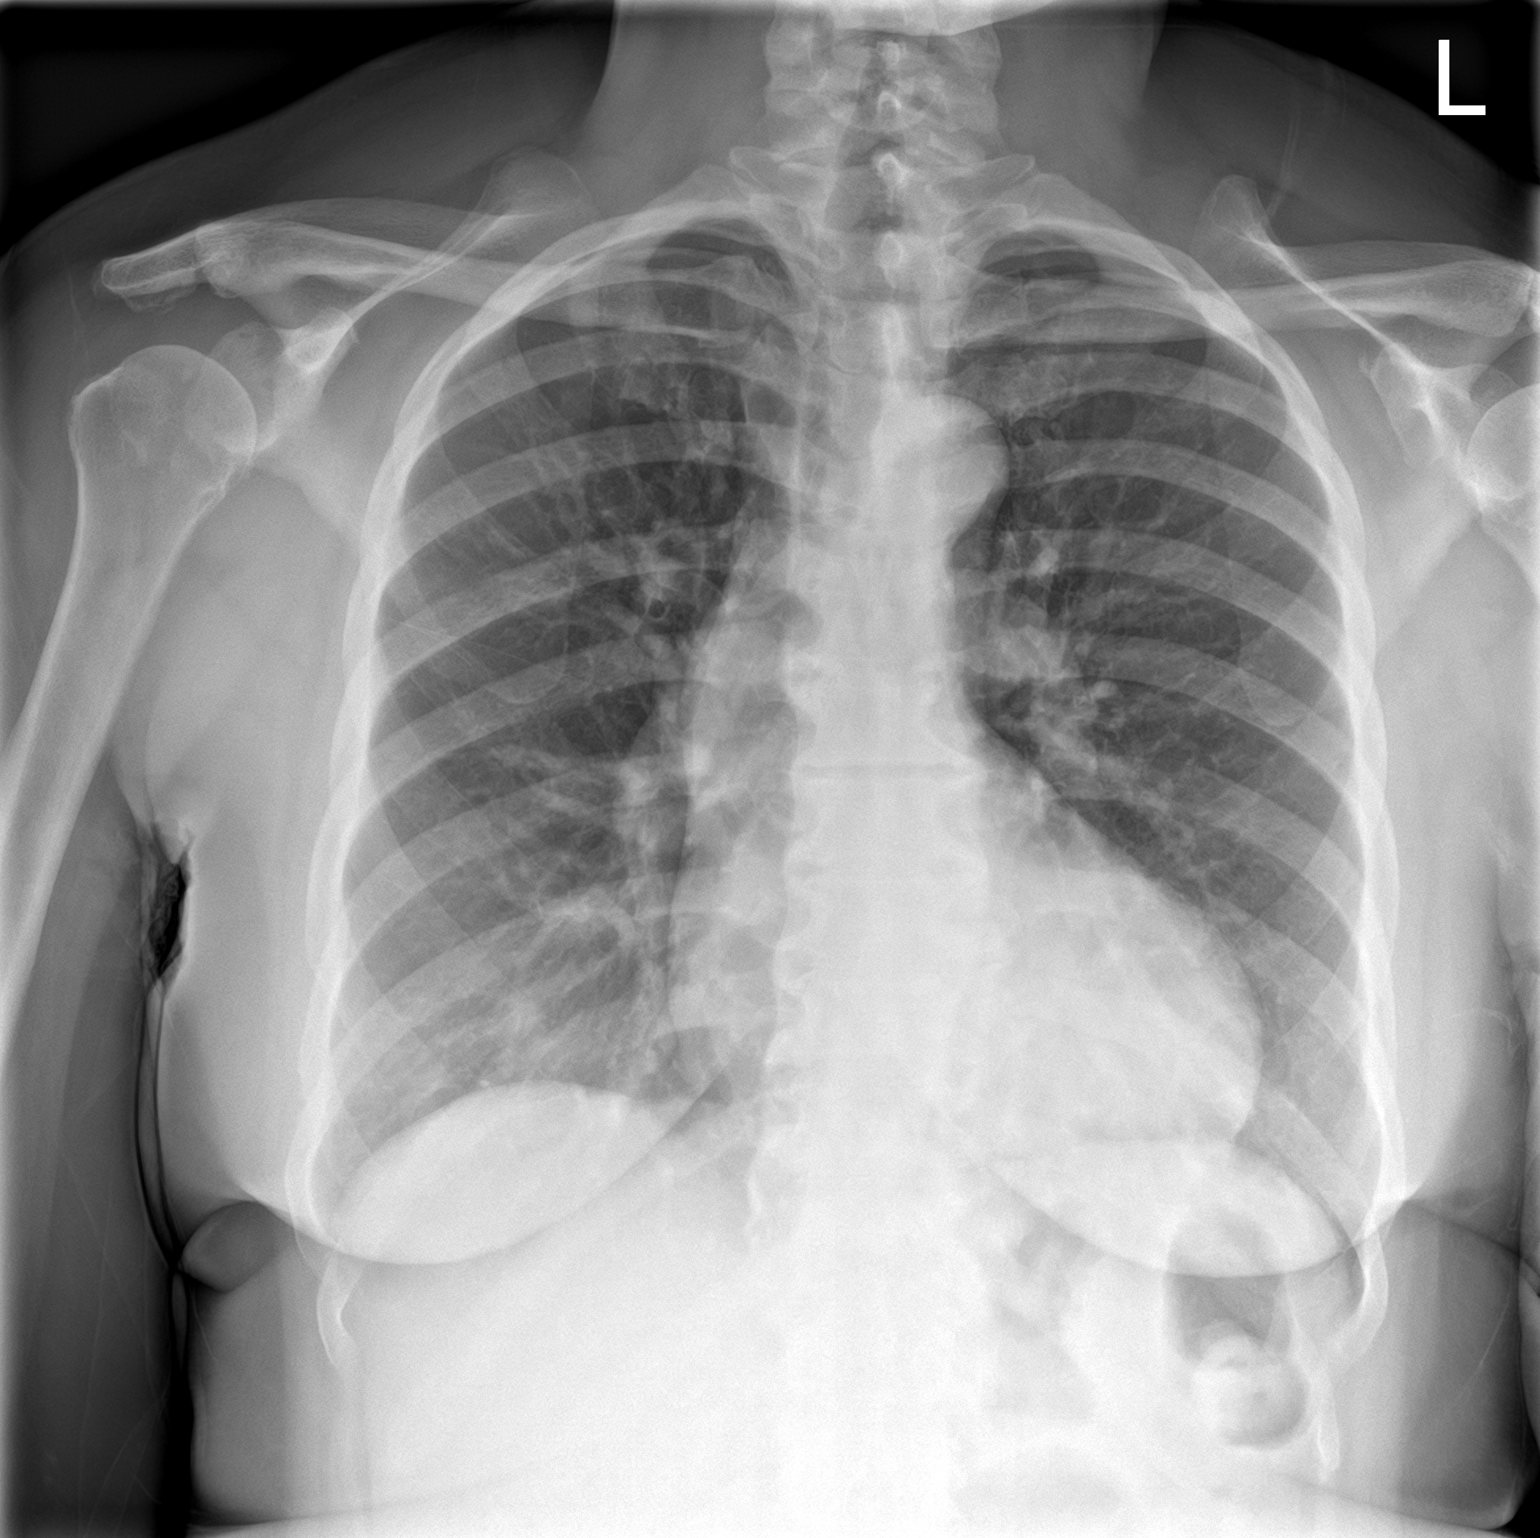

[chest lat]
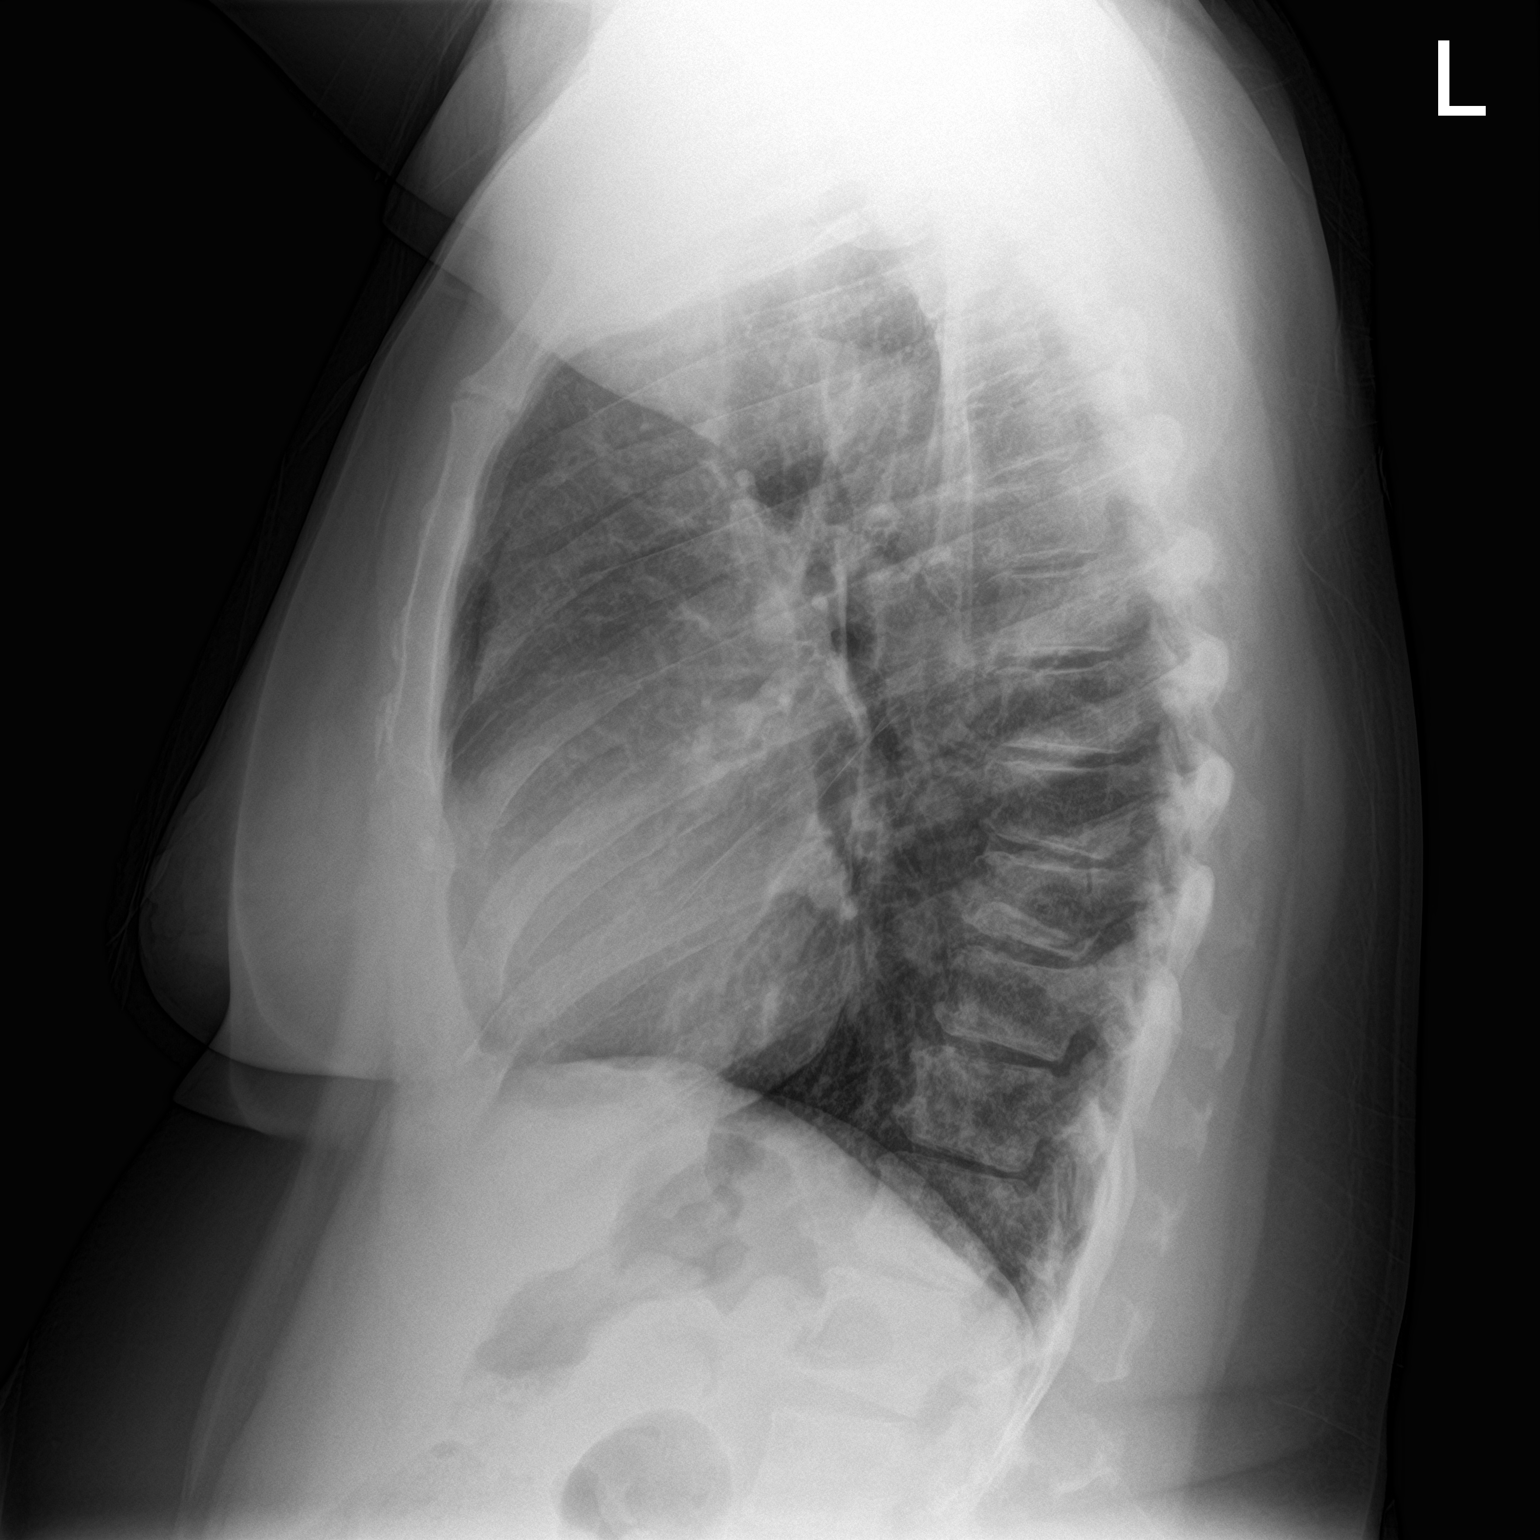

[2 of 2 positions shown; findings below may reference images not displayed]

FINDINGS: No focal consolidation, pleural effusion, pneumothorax the cardiac
silhouette is within normal limits. No acute osseous pathology.
Degenerative changes of the spine.
IMPRESSION: No active cardiopulmonary disease.

## 2022-11-05 ENCOUNTER — Ambulatory Visit
Admission: RE | Admit: 2022-11-05 | Discharge: 2022-11-05 | Disposition: A | Discharge status: Home / Self Care | Payer: 59 | Source: Ambulatory Visit | Attending: Family Medicine | Admitting: Family Medicine

## 2022-11-05 DIAGNOSIS — Z1231 Encounter for screening mammogram for malignant neoplasm of breast: Secondary | ICD-10-CM | POA: Insufficient documentation

## 2022-11-07 ENCOUNTER — Other Ambulatory Visit: Payer: Self-pay | Admitting: Family Medicine

## 2022-11-07 DIAGNOSIS — R928 Other abnormal and inconclusive findings on diagnostic imaging of breast: Secondary | ICD-10-CM

## 2022-11-07 DIAGNOSIS — N63 Unspecified lump in unspecified breast: Secondary | ICD-10-CM

## 2022-11-12 ENCOUNTER — Emergency Department: Payer: 59

## 2022-11-12 ENCOUNTER — Other Ambulatory Visit: Payer: Self-pay

## 2022-11-12 ENCOUNTER — Emergency Department
Admission: EM | Admit: 2022-11-12 | Discharge: 2022-11-12 | Disposition: A | Discharge status: Home / Self Care | Payer: 59 | Attending: Emergency Medicine | Admitting: Emergency Medicine

## 2022-11-12 ENCOUNTER — Encounter: Payer: Self-pay | Admitting: Emergency Medicine

## 2022-11-12 DIAGNOSIS — J029 Acute pharyngitis, unspecified: Secondary | ICD-10-CM | POA: Diagnosis not present

## 2022-11-12 DIAGNOSIS — E871 Hypo-osmolality and hyponatremia: Secondary | ICD-10-CM | POA: Diagnosis not present

## 2022-11-12 DIAGNOSIS — R600 Localized edema: Secondary | ICD-10-CM | POA: Diagnosis not present

## 2022-11-12 DIAGNOSIS — Z1152 Encounter for screening for COVID-19: Secondary | ICD-10-CM | POA: Insufficient documentation

## 2022-11-12 DIAGNOSIS — D72829 Elevated white blood cell count, unspecified: Secondary | ICD-10-CM | POA: Diagnosis not present

## 2022-11-12 DIAGNOSIS — J353 Hypertrophy of tonsils with hypertrophy of adenoids: Secondary | ICD-10-CM | POA: Diagnosis not present

## 2022-11-12 LAB — CBC WITH DIFFERENTIAL/PLATELET
Abs Immature Granulocytes: 0.14 10*3/uL — ABNORMAL HIGH (ref 0.00–0.07)
Basophils Absolute: 0.1 10*3/uL (ref 0.0–0.1)
Basophils Relative: 0 %
Eosinophils Absolute: 0.1 10*3/uL (ref 0.0–0.5)
Eosinophils Relative: 1 %
HCT: 39.8 % (ref 36.0–46.0)
Hemoglobin: 12.7 g/dL (ref 12.0–15.0)
Immature Granulocytes: 1 %
Lymphocytes Relative: 7 %
Lymphs Abs: 1.3 10*3/uL (ref 0.7–4.0)
MCH: 29.1 pg (ref 26.0–34.0)
MCHC: 31.9 g/dL (ref 30.0–36.0)
MCV: 91.1 fL (ref 80.0–100.0)
Monocytes Absolute: 1 10*3/uL (ref 0.1–1.0)
Monocytes Relative: 6 %
Neutro Abs: 14.9 10*3/uL — ABNORMAL HIGH (ref 1.7–7.7)
Neutrophils Relative %: 85 %
Platelets: 262 10*3/uL (ref 150–400)
RBC: 4.37 MIL/uL (ref 3.87–5.11)
RDW: 15.3 % (ref 11.5–15.5)
WBC: 17.4 10*3/uL — ABNORMAL HIGH (ref 4.0–10.5)
nRBC: 0 % (ref 0.0–0.2)

## 2022-11-12 LAB — COMPREHENSIVE METABOLIC PANEL
ALT: 19 U/L (ref 0–44)
AST: 27 U/L (ref 15–41)
Albumin: 4.4 g/dL (ref 3.5–5.0)
Alkaline Phosphatase: 69 U/L (ref 38–126)
Anion gap: 12 (ref 5–15)
BUN: 17 mg/dL (ref 6–20)
CO2: 19 mmol/L — ABNORMAL LOW (ref 22–32)
Calcium: 9.6 mg/dL (ref 8.9–10.3)
Chloride: 103 mmol/L (ref 98–111)
Creatinine, Ser: 1.45 mg/dL — ABNORMAL HIGH (ref 0.44–1.00)
GFR, Estimated: 41 mL/min — ABNORMAL LOW (ref >60)
Glucose, Bld: 119 mg/dL — ABNORMAL HIGH (ref 70–99)
Potassium: 4.5 mmol/L (ref 3.5–5.1)
Sodium: 134 mmol/L — ABNORMAL LOW (ref 135–145)
Total Bilirubin: 1.9 mg/dL — ABNORMAL HIGH (ref 0.3–1.2)
Total Protein: 9.4 g/dL — ABNORMAL HIGH (ref 6.5–8.1)

## 2022-11-12 LAB — RESP PANEL BY RT-PCR (RSV, FLU A&B, COVID)  RVPGX2
Influenza A by PCR: NEGATIVE
Influenza B by PCR: NEGATIVE
Resp Syncytial Virus by PCR: NEGATIVE
SARS Coronavirus 2 by RT PCR: NEGATIVE

## 2022-11-12 LAB — GROUP A STREP BY PCR: Group A Strep by PCR: NOT DETECTED

## 2022-11-12 LAB — PROCALCITONIN: Procalcitonin: <0.1 ng/mL

## 2022-11-12 LAB — LACTIC ACID, PLASMA: Lactic Acid, Venous: 1.2 mmol/L (ref 0.5–1.9)

## 2022-11-12 MED ORDER — IBUPROFEN 100 MG/5ML PO SUSP
ORAL | Status: AC
  Filled 2022-11-12: qty 30

## 2022-11-12 MED ORDER — METHYLPREDNISOLONE ACETATE 40 MG/ML IJ SUSP
40.0000 mg | Freq: Once | INTRAMUSCULAR | Status: AC
  Administered 2022-11-12: 40 mg via INTRAMUSCULAR
  Filled 2022-11-12: qty 1

## 2022-11-12 MED ORDER — DEXAMETHASONE SODIUM PHOSPHATE 10 MG/ML IJ SOLN
10.0000 mg | Freq: Once | INTRAMUSCULAR | Status: AC
  Administered 2022-11-12: 10 mg via INTRAVENOUS
  Filled 2022-11-12: qty 1

## 2022-11-12 MED ORDER — IOHEXOL 300 MG/ML  SOLN
60.0000 mL | Freq: Once | INTRAMUSCULAR | Status: AC | PRN
  Administered 2022-11-12: 60 mL via INTRAVENOUS

## 2022-11-12 MED ORDER — CEFTRIAXONE SODIUM 1 G IJ SOLR
2.0000 g | Freq: Once | INTRAMUSCULAR | Status: AC
  Administered 2022-11-12: 2 g via INTRAMUSCULAR
  Filled 2022-11-12: qty 20

## 2022-11-12 MED ORDER — AMOXICILLIN-POT CLAVULANATE 875-125 MG PO TABS: 1.0000 | ORAL_TABLET | Freq: Two times a day (BID) | ORAL | 0 refills | Status: AC

## 2022-11-12 MED ORDER — IOHEXOL 300 MG/ML  SOLN: 75.0000 mL | Freq: Once | INTRAMUSCULAR | Status: DC | PRN

## 2022-11-12 MED ORDER — IBUPROFEN 100 MG/5ML PO SUSP
600.0000 mg | Freq: Once | ORAL | Status: AC
  Administered 2022-11-12: 600 mg via ORAL
  Filled 2022-11-12: qty 30

## 2022-11-12 MED ORDER — ACETAMINOPHEN 325 MG PO TABS
650.0000 mg | ORAL_TABLET | Freq: Once | ORAL | Status: AC | PRN
  Administered 2022-11-12: 650 mg via ORAL
  Filled 2022-11-12: qty 2

## 2022-11-12 MED ORDER — SODIUM CHLORIDE 0.9 % IV SOLN
3.0000 g | Freq: Once | INTRAVENOUS | Status: AC
  Administered 2022-11-12: 3 g via INTRAVENOUS
  Filled 2022-11-12: qty 8

## 2022-11-12 MED ORDER — PREDNISONE 10 MG (21) PO TBPK: ORAL_TABLET | ORAL | 0 refills | Status: AC

## 2022-11-12 NOTE — Discharge Instructions (Signed)
Take tapered prednisone and Augmentin as directed. Please make follow-up appointment with Dr. Jenne Campus.

## 2022-11-12 NOTE — ED Provider Notes (Signed)
[facility] Provider Note  Patient Contact: 8:58 PM (approximate)   History   Sore Throat   HPI  Sara Reed is a 62 y.o. female with an unremarkable past medical history, presents to the emergency department with pharyngitis and fever for the past 2 days, patient reports that it is been difficult for her to swallow at home but she has been able to manage her own secretions.  No prior history of PTA.  No sick contacts in the home with similar symptoms.  No chest pain or abdominal pain.      Physical Exam   Triage Vital Signs: ED Triage Vitals  Encounter Vitals Group     BP 11/12/22 1843 (!) 165/103     Systolic BP Percentile --      Diastolic BP Percentile --      Pulse Rate 11/12/22 1843 (!) 105     Resp 11/12/22 1843 18     Temp 11/12/22 1843 (!) 103.2 F (39.6 C)     Temp Source 11/12/22 1843 Oral     SpO2 11/12/22 1843 100 %     Weight 11/12/22 1844 200 lb (90.7 kg)     Height 11/12/22 1844 5\' 3"  (1.6 m)     Head Circumference --      Peak Flow --      Pain Score 11/12/22 1844 8     Pain Loc --      Pain Education --      Exclude from Growth Chart --     Most recent vital signs: Vitals:   11/12/22 2115 11/12/22 2242  BP:  (!) 161/99  Pulse:  89  Resp:  20  Temp: (!) 101.2 F (38.4 C) 99 F (37.2 C)  SpO2:  99%     General: Alert and in no acute distress. Eyes:  PERRL. EOMI. Head: No acute traumatic findings ENT:      Nose: No congestion/rhinnorhea.      Mouth/Throat: Mucous membranes are moist.  Posterior pharynx is erythematous. Neck: No stridor. No cervical spine tenderness to palpation. Hematological/Lymphatic/Immunilogical: Palpable cervical lymphadenopathy. Cardiovascular:  Good peripheral perfusion Respiratory: Normal respiratory effort without tachypnea or retractions. Lungs CTAB. Good air entry to the bases with no decreased or absent breath sounds. Gastrointestinal: Bowel sounds 4 quadrants. Soft and nontender  to palpation. No guarding or rigidity. No palpable masses. No distention. No CVA tenderness. Musculoskeletal: Full range of motion to all extremities.  Neurologic:  No gross focal neurologic deficits are appreciated.  Skin:   No rash noted    ED Results / Procedures / Treatments   Labs (all labs ordered are listed, but only abnormal results are displayed) Labs Reviewed  CBC WITH DIFFERENTIAL/PLATELET - Abnormal; Notable for the following components:      Result Value   WBC 17.4 (*)    Neutro Abs 14.9 (*)    Abs Immature Granulocytes 0.14 (*)    All other components within normal limits  COMPREHENSIVE METABOLIC PANEL - Abnormal; Notable for the following components:   Sodium 134 (*)    CO2 19 (*)    Glucose, Bld 119 (*)    Creatinine, Ser 1.45 (*)    Total Protein 9.4 (*)    Total Bilirubin 1.9 (*)    GFR, Estimated 41 (*)    All other components within normal limits  GROUP A STREP BY PCR  RESP PANEL BY RT-PCR (RSV, FLU A&B, COVID)  RVPGX2  LACTIC ACID, PLASMA  PROCALCITONIN  LACTIC ACID, PLASMA       RADIOLOGY  I personally viewed and evaluated these images as part of my medical decision making, as well as reviewing the written report by the radiologist.  ED Provider Interpretation: CT soft tissue neck with diffuse edema of the oropharynx but no other acute abnormalities.   PROCEDURES:  Critical Care performed: No  Procedures   MEDICATIONS ORDERED IN ED: Medications  ibuprofen (ADVIL) 100 MG/5ML suspension (  Not Given 11/12/22 2242)  cefTRIAXone (ROCEPHIN) injection 2 g (has no administration in time range)  methylPREDNISolone acetate (DEPO-MEDROL) injection 40 mg (has no administration in time range)  acetaminophen (TYLENOL) tablet 650 mg (650 mg Oral Given 11/12/22 1847)  Ampicillin-Sulbactam (UNASYN) 3 g in sodium chloride 0.9 % 100 mL IVPB (0 g Intravenous Stopped 11/12/22 2216)  dexamethasone (DECADRON) injection 10 mg (10 mg Intravenous Given 11/12/22  2116)  ibuprofen (ADVIL) 100 MG/5ML suspension 600 mg (600 mg Oral Given 11/12/22 2129)  iohexol (OMNIPAQUE) 300 MG/ML solution 60 mL (60 mLs Intravenous Contrast Given 11/12/22 2214)     IMPRESSION / MDM / ASSESSMENT AND PLAN / ED COURSE  I reviewed the triage vital signs and the nursing notes.                              Assessment and plan Pharyngitis 62 year old female presents to the emergency department with pharyngitis for the past 2 days.  Patient was febrile, tachycardic and hypertensive at triage but vital signs were otherwise reassuring.  On exam, patient was alert and nontoxic-appearing.  She was maintaining her own secretions.  CMP with mild hyponatremia but otherwise consistent with baseline labs.  Patient's white blood cell count elevated at 17.4 with left shift.  Lactic acid and procalcitonin within range.  COVID and flu negative.  CT soft tissue neck with oropharynx edema consistent with pharyngitis but no other acute abnormalities.  I did review patient case with otolaryngologist on-call, Dr. Jenne Campus who recommended Depo-Medrol and 2 g of Rocephin IM prior to discharge and 12-day taper of prednisone with oral Augmentin.  He anticipates seeing patient as an outpatient in 2 to 3 days.  I recommended return to the emergency department for reevaluation if patient feels as though she is worsening at home.  She voiced understanding and feels comfortable with this plan.      FINAL CLINICAL IMPRESSION(S) / ED DIAGNOSES   Final diagnoses:  Pharyngitis, unspecified etiology     Rx / DC Orders   ED Discharge Orders          Ordered    amoxicillin-clavulanate (AUGMENTIN) 875-125 MG tablet  2 times daily        11/12/22 2324    predniSONE (STERAPRED UNI-PAK 21 TAB) 10 MG (21) TBPK tablet        11/12/22 2324             Note:  This document was prepared using Dragon voice recognition software and may include unintentional dictation errors.   Pia Mau [place],  PA-C 11/12/22 2333    Jene Every, MD 11/16/22 [phone]

## 2022-11-12 NOTE — ED Triage Notes (Signed)
Patient to ED via POV for sore throat. Hurts to swallow x2 days.

## 2023-01-03 DIAGNOSIS — I10 Essential (primary) hypertension: Secondary | ICD-10-CM | POA: Diagnosis not present

## 2023-01-03 DIAGNOSIS — E785 Hyperlipidemia, unspecified: Secondary | ICD-10-CM | POA: Diagnosis not present

## 2023-01-03 DIAGNOSIS — E119 Type 2 diabetes mellitus without complications: Secondary | ICD-10-CM | POA: Diagnosis not present

## 2023-01-08 DIAGNOSIS — D649 Anemia, unspecified: Secondary | ICD-10-CM | POA: Diagnosis not present

## 2023-01-08 DIAGNOSIS — R5383 Other fatigue: Secondary | ICD-10-CM | POA: Diagnosis not present

## 2023-01-13 ENCOUNTER — Encounter: Payer: Self-pay | Admitting: *Deleted

## 2023-01-13 ENCOUNTER — Emergency Department
Admission: EM | Admit: 2023-01-13 | Discharge: 2023-01-13 | Disposition: A | Discharge status: Home / Self Care | Payer: 59 | Attending: Emergency Medicine | Admitting: Emergency Medicine

## 2023-01-13 ENCOUNTER — Other Ambulatory Visit: Payer: Self-pay

## 2023-01-13 ENCOUNTER — Emergency Department: Payer: 59

## 2023-01-13 DIAGNOSIS — R0789 Other chest pain: Secondary | ICD-10-CM | POA: Diagnosis not present

## 2023-01-13 DIAGNOSIS — I10 Essential (primary) hypertension: Secondary | ICD-10-CM | POA: Insufficient documentation

## 2023-01-13 DIAGNOSIS — R079 Chest pain, unspecified: Secondary | ICD-10-CM | POA: Diagnosis not present

## 2023-01-13 LAB — CBC
HCT: 37 % (ref 36.0–46.0)
Hemoglobin: 11.5 g/dL — ABNORMAL LOW (ref 12.0–15.0)
MCH: 29.5 pg (ref 26.0–34.0)
MCHC: 31.1 g/dL (ref 30.0–36.0)
MCV: 94.9 fL (ref 80.0–100.0)
Platelets: 284 10*3/uL (ref 150–400)
RBC: 3.9 MIL/uL (ref 3.87–5.11)
RDW: 15.6 % — ABNORMAL HIGH (ref 11.5–15.5)
WBC: 5.7 10*3/uL (ref 4.0–10.5)
nRBC: 0 % (ref 0.0–0.2)

## 2023-01-13 LAB — BASIC METABOLIC PANEL
Anion gap: 7 (ref 5–15)
BUN: 20 mg/dL (ref 6–20)
CO2: 24 mmol/L (ref 22–32)
Calcium: 9.7 mg/dL (ref 8.9–10.3)
Chloride: 107 mmol/L (ref 98–111)
Creatinine, Ser: 1.09 mg/dL — ABNORMAL HIGH (ref 0.44–1.00)
GFR, Estimated: 58 mL/min — ABNORMAL LOW (ref >60)
Glucose, Bld: 103 mg/dL — ABNORMAL HIGH (ref 70–99)
Potassium: 4.1 mmol/L (ref 3.5–5.1)
Sodium: 138 mmol/L (ref 135–145)

## 2023-01-13 LAB — TROPONIN I (HIGH SENSITIVITY): Troponin I (High Sensitivity): 4 ng/L (ref <18)

## 2023-01-13 MED ORDER — OMEPRAZOLE MAGNESIUM 20 MG PO TBEC: 20.0000 mg | DELAYED_RELEASE_TABLET | Freq: Every day | ORAL | 0 refills | Status: AC

## 2023-01-13 NOTE — ED Triage Notes (Signed)
Pt reports that she woke up this am with chest pain which she felt was indigestion.  She has been taking rolaids and tums with temporary relief but pain then returns.  Pt is reporting throbbing and "hurting feeling" in left chest 8/10 and feels like she "needs to belch"

## 2023-01-13 NOTE — ED Provider Notes (Signed)
[facility] Provider Note    Event Date/Time   First MD Initiated Contact with Patient 01/13/23 1253     (approximate)   History   Chest Pain   HPI  Sara Reed is a 62 y.o. female with a history of hypertension, hypercholesterolemia who presents with complaints of chest discomfort, patient reports that this developed earlier this morning but now has resolved.  She thought it was related to digestion.  No shortness of breath, no calf pain or swelling.  No pleurisy.  No fevers chills or cough     Physical Exam   Triage Vital Signs: ED Triage Vitals  Encounter Vitals Group     BP 01/13/23 1134 (!) 135/94     Systolic BP Percentile --      Diastolic BP Percentile --      Pulse Rate 01/13/23 1134 70     Resp 01/13/23 1134 16     Temp 01/13/23 1134 98.2 F (36.8 C)     Temp Source 01/13/23 1134 Oral     SpO2 01/13/23 1134 100 %     Weight 01/13/23 1255 90.7 kg (199 lb 15.3 oz)     Height 01/13/23 1255 1.6 m (5\' 3" )     Head Circumference --      Peak Flow --      Pain Score 01/13/23 1135 8     Pain Loc --      Pain Education --      Exclude from Growth Chart --     Most recent vital signs: Vitals:   01/13/23 1134  BP: (!) 135/94  Pulse: 70  Resp: 16  Temp: 98.2 F (36.8 C)  SpO2: 100%     General: Awake, no distress.  CV:  Good peripheral perfusion.  Resp:  Normal effort.  Clear to auscultation bilaterally Abd:  No distention.  Other:  No calf pain or swelling   ED Results / Procedures / Treatments   Labs (all labs ordered are listed, but only abnormal results are displayed) Labs Reviewed  BASIC METABOLIC PANEL - Abnormal; Notable for the following components:      Result Value   Glucose, Bld 103 (*)    Creatinine, Ser 1.09 (*)    GFR, Estimated 58 (*)    All other components within normal limits  CBC - Abnormal; Notable for the following components:   Hemoglobin 11.5 (*)    RDW 15.6 (*)    All other components  within normal limits  POC URINE PREG, ED  TROPONIN I (HIGH SENSITIVITY)  TROPONIN I (HIGH SENSITIVITY)     EKG  ED ECG REPORT I, Jene Every, the attending physician, personally viewed and interpreted this ECG.  Date: 01/13/2023  Rhythm: normal sinus rhythm QRS Axis: normal Intervals: normal ST/T Wave abnormalities: normal Narrative Interpretation: no evidence of acute ischemia    RADIOLOGY Chest x-ray viewed interpret by me, no acute abnormality    PROCEDURES:  Critical Care performed:   Procedures   MEDICATIONS ORDERED IN ED: Medications - No data to display   IMPRESSION / MDM / ASSESSMENT AND PLAN / ED COURSE  I reviewed the triage vital signs and the nursing notes. Patient's presentation is most consistent with acute presentation with potential threat to life or bodily function.  Patient presents with chest pain as detailed above, symptoms now resolved.  Differential includes ACS, angina, pneumonia, GERD/esophagitis  Overall well-appearing and in no acute distress, EKG is reassuring, high sensitive troponin  is normal, chest x-ray is unremarkable  Not consistent with ACS or pneumonia.  Indication for admission at this time, appropriate for outpatient follow-up, return precautions discussed, patient agrees with this plan.        FINAL CLINICAL IMPRESSION(S) / ED DIAGNOSES   Final diagnoses:  Atypical chest pain     Rx / DC Orders   ED Discharge Orders          Ordered    omeprazole (PRILOSEC OTC) 20 MG tablet  Daily        01/13/23 1323             Note:  This document was prepared using Dragon voice recognition software and may include unintentional dictation errors.   Jene Every, MD 01/13/23 [phone]

## 2023-02-17 ENCOUNTER — Telehealth: Payer: Self-pay | Admitting: *Deleted

## 2023-02-17 NOTE — Telephone Encounter (Signed)
Transition Care Management Unsuccessful Follow-up Telephone Call  Date of discharge and from where:  [facility] 01/13/2023  Attempts:  1st Attempt  Reason for unsuccessful TCM follow-up call:  No answer/busy

## 2023-02-18 ENCOUNTER — Telehealth: Payer: Self-pay | Admitting: *Deleted

## 2023-02-18 NOTE — Telephone Encounter (Signed)
Transition Care Management Unsuccessful Follow-up Telephone Call  Date of discharge and from where:  [facility]  01/13/2023  Attempts:  2nd Attempt  Reason for unsuccessful TCM follow-up call:  No answer/busy

## 2023-02-20 DIAGNOSIS — D649 Anemia, unspecified: Secondary | ICD-10-CM | POA: Diagnosis not present

## 2023-02-20 DIAGNOSIS — E785 Hyperlipidemia, unspecified: Secondary | ICD-10-CM | POA: Diagnosis not present

## 2023-03-04 ENCOUNTER — Other Ambulatory Visit: Payer: 59

## 2023-03-14 ENCOUNTER — Ambulatory Visit
Admission: RE | Admit: 2023-03-14 | Discharge: 2023-03-14 | Disposition: A | Discharge status: Home / Self Care | Payer: 59 | Source: Ambulatory Visit | Attending: Family Medicine | Admitting: Family Medicine

## 2023-03-14 DIAGNOSIS — N63 Unspecified lump in unspecified breast: Secondary | ICD-10-CM | POA: Insufficient documentation

## 2023-03-14 DIAGNOSIS — R92343 Mammographic extreme density, bilateral breasts: Secondary | ICD-10-CM | POA: Diagnosis not present

## 2023-03-14 DIAGNOSIS — R928 Other abnormal and inconclusive findings on diagnostic imaging of breast: Secondary | ICD-10-CM

## 2023-12-22 ENCOUNTER — Encounter: Payer: Self-pay | Admitting: Family Medicine

## 2023-12-23 ENCOUNTER — Other Ambulatory Visit: Payer: Self-pay | Admitting: Family Medicine

## 2023-12-23 DIAGNOSIS — Z1231 Encounter for screening mammogram for malignant neoplasm of breast: Secondary | ICD-10-CM

## 2024-01-10 ENCOUNTER — Other Ambulatory Visit: Payer: Self-pay | Admitting: Family

## 2024-01-28 ENCOUNTER — Ambulatory Visit: Attending: Family Medicine

## 2024-01-28 DIAGNOSIS — M25651 Stiffness of right hip, not elsewhere classified: Secondary | ICD-10-CM | POA: Diagnosis present

## 2024-01-28 DIAGNOSIS — M25652 Stiffness of left hip, not elsewhere classified: Secondary | ICD-10-CM | POA: Insufficient documentation

## 2024-01-28 DIAGNOSIS — M5459 Other low back pain: Secondary | ICD-10-CM | POA: Insufficient documentation

## 2024-01-28 NOTE — Therapy (Signed)
 OUTPATIENT PHYSICAL THERAPY THORACOLUMBAR EVALUATION   Patient Name: Sara Reed MRN: [id number] DOB:05-09-1960, 63 y.o., female Today's Date: 01/28/2024  END OF SESSION:  PT End of Session - 01/28/24 1519     Visit Number 1    Number of Visits 17    Date for Recertification  03/26/24    PT Start Time 1520    PT Stop Time 1616    PT Time Calculation (min) 56 min    Activity Tolerance Patient tolerated treatment well    Behavior During Therapy WFL for tasks assessed/performed          Past Medical History:  Diagnosis Date   Anemia    CHF (congestive heart failure) (HCC)    Hypertension    Past Surgical History:  Procedure Laterality Date   ORIF ANKLE FRACTURE Right 06/04/2019   Procedure: OPEN REDUCTION INTERNAL FIXATION WITH DISTAL FIBULA PLATE AND SYNDESMOTIC REPAIR OF RIGHT ANKLE.;  Surgeon: Mardee Lynwood SQUIBB, MD;  Location: [facility] ORS;  Service: Orthopedics;  Laterality: Right;   Patient Active Problem List   Diagnosis Date Noted   Acute CHF (HCC) 02/13/2022   Hypertension    Myocardial injury    Hypertensive urgency    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 05/03/2020   Acute respiratory failure with hypoxemia (HCC) 06/22/2017    PCP: Tobie Domino, MD   REFERRING PROVIDER: Domino Tobie, MD  REFERRING DIAG: Diagnosis M54.50 (ICD-10-CM) - Low back pain  Rationale for Evaluation and Treatment: Rehabilitation  THERAPY DIAG:  Other low back pain - Plan: PT plan of care cert/re-cert  Stiffness of right hip, not elsewhere classified - Plan: PT plan of care cert/re-cert  Stiffness of left hip, not elsewhere classified - Plan: PT plan of care cert/re-cert  ONSET DATE: 12/23/2023 (date PT referral signed)  SUBJECTIVE:                                                                                                                                                                                           SUBJECTIVE  STATEMENT: Low back pain: 10/10 at most for the past 3 months.   PERTINENT HISTORY:  Low back pain. Pain started a while ago (some months ago), gradual onset. Has been using a heating pad and lidocaine  patches. Was told she had tight muscles. Has not yet had imaging for her back.    No latex allergies.   PAIN:  Are you having pain? Yes: NPRS scale: 10/10 currently (pt sitting) Pain location: low back  Pain description: sore, tight, stiff.  Aggravating factors: standing for a long period of time about 45 min  or less, standing to do chores. Picking up something from the ground. Going up and down steps bothers her back a little.  Possibly lifting.   Relieving factors: heating pad, lidocaine  patch, deep tissue massage, bending over/leaning over  PRECAUTIONS: No known precautions.  RED FLAGS: Bowel or bladder incontinence: No and Cauda equina syndrome: No   WEIGHT BEARING RESTRICTIONS: No  FALLS:  Has patient fallen in last 6 months? No  LIVING ENVIRONMENT: Lives with: lives with their family and grandchildren Lives in: House/apartment Stairs: No Has following equipment at home: None  OCCUPATION: Working, going to retire next February. Pt is a custodian and cleans all day long.   PLOF: Independent  PATIENT GOALS: be able to stand more comfortably so she can fold laundry on her bed (waist high bed)  NEXT MD VISIT: none yet.   OBJECTIVE:  Note: Objective measures were completed at Evaluation unless otherwise noted.  DIAGNOSTIC FINDINGS:    PATIENT SURVEYS:  Modified Oswestry:  MODIFIED OSWESTRY DISABILITY SCALE  Date: 01/28/2024 Score  Pain intensity 1 = The pain is bad, but I can manage without having to take (1) I can stand as long as I want but, it increases my pain. pain medication.  2. Personal care (washing, dressing, etc.) 0 =  I can take care of myself normally without causing increased pain.  3. Lifting 4 = I can lift only very light weights  4. Walking 0 = Pain  does not prevent me from walking any distance  5. Sitting 0 =  I can sit in any chair as long as I like.  6. Standing 2 =  Pain prevents me from standing more than 1 hour  7. Sleeping 1 = I can sleep well only by using pain medication.  8. Social Life 2 = Pain prevents me from participating in more energetic activities (eg. sports, dancing).  9. Traveling 0 =  I can travel anywhere without increased pain.  10. Employment/ Homemaking 3 = Pain prevents me from doing anything but light duties.  Total 13/50 (26%)   Interpretation of scores: Score Category Description  0-20% Minimal Disability The patient can cope with most living activities. Usually no treatment is indicated apart from advice on lifting, sitting and exercise  21-40% Moderate Disability The patient experiences more pain and difficulty with sitting, lifting and standing. Travel and social life are more difficult and they may be disabled from work. Personal care, sexual activity and sleeping are not grossly affected, and the patient can usually be managed by conservative means  41-60% Severe Disability Pain remains the main problem in this group, but activities of daily living are affected. These patients require a detailed investigation  61-80% Crippled Back pain impinges on all aspects of the patient's life. Positive intervention is required  81-100% Bed-bound  These patients are either bed-bound or exaggerating their symptoms  Bluford FORBES Zoe DELENA Karon DELENA, et al. Surgery versus conservative management of stable thoracolumbar fracture: the PRESTO feasibility RCT. [place] ([place]): [employer]; 2021 Nov. [facility] Technology Assessment, No. 25.62.) Appendix 3, Oswestry Disability Index category descriptors. Available from: FindJewel[employer].cz  Minimally Clinically Important Difference (MCID) = 12.8%  COGNITION: Overall cognitive status: Within functional limits for tasks  assessed     SENSATION: WFL  MUSCLE LENGTH:   POSTURE: L cervical side bend, B protracted shoulders, R shoulder higher, R lateral shift, Movement preference around L3/4 area, L trunk rotation, L lumbar convexity. R knee higher, slight R foot pronation  Decreased  pain with manual L to R pressure to decrease L lumbar convexity  Increased lumbar lordosis around L3/4 area  PALPATION: TTP to low back area around L3/4 TTP R > L L5, L4, L3 TP Decreased R UPA to L5, L4, L3 TP Muscle tension R lumbar paraspinal muscles Decreased CPA to mid thoracic spine.     LUMBAR ROM:   AROM eval  Flexion WFL, decreased back pain, hinging at L3/4 area  Extension [facility] with pain and hinging at L3/4 area  Right lateral flexion WFL with low back pain  Left lateral flexion WFL with low back pain  Right rotation [facility] with low back pain  Left rotation WFL with low back pain.    (Blank rows = not tested)  LOWER EXTREMITY ROM:     Passive  Right eval Left eval  Hip flexion    Hip extension -7  -5  Hip abduction    Hip adduction    Hip internal rotation 32 24  Hip external rotation    Knee flexion    Knee extension    Ankle dorsiflexion    Ankle plantarflexion    Ankle inversion    Ankle eversion     (Blank rows = not tested)  LOWER EXTREMITY MMT:    MMT Right eval Left eval  Hip flexion 4 4  Hip extension 4- 3+  Hip abduction 4- 3-  Hip adduction    Hip internal rotation    Hip external rotation    Knee flexion 5 4  Knee extension 5 5  Ankle dorsiflexion    Ankle plantarflexion    Ankle inversion    Ankle eversion     (Blank rows = not tested)  LUMBAR SPECIAL TESTS:  (-) repeated flexion test with decreased back pain reported     FUNCTIONAL TESTS:    GAIT: Distance walked: 30 ft Assistive device utilized: None Level of assistance: Complete Independence Comments: increased L trunk rotation during R LE swing phase Decreased stance R LE during stance phase Increased  lumbar rotation around L3/4  Stairs with B UE assist   Genu valgus, with contralateral pelvic drop and ipsilateral lumbar side bend compensation due to glute and trunk weakness.   TREATMENT DATE: 01/28/2024                                                                                                                               Blood pressure L arm sitting, mechanically taken, large cuff: 151/81, HR 73  Therapeutic exercise   Seated trunk flexion stretch: decreased back pain Standing lumbar flexion: decreased back pain.   Standing glute set 10x10 seconds   Improved exercise technique, movement at target joints, use of target muscles after mod verbal, visual, tactile cues.   Decreased back pain to 3/10 after session  PATIENT EDUCATION:  Education details: there-ex, HEP, POC Person educated: Patient Education method: Explanation, Demonstration, Tactile cues, Verbal cues, and Handouts Education comprehension: verbalized  understanding and returned demonstration  HOME EXERCISE PROGRAM: Access Code: [email or web address] URL: [email or web address] Date: 01/28/2024 Prepared by: Emil Glassman  Exercises - Standing Gluteal Sets  - 3 x daily - 7 x weekly - 3 sets - 10 reps - 10 seconds hold - Seated Flexion Stretch  - 3 x daily - 7 x weekly - 1 sets - 5 reps - 30 seconds hold  ASSESSMENT:  CLINICAL IMPRESSION: Patient is a 63 y.o. female who was seen today for physical therapy evaluation and treatment for low back pain. She also presents with altered gait pattern and posture, hinging at low back area during gait and returning from the bent over position, movement preference around L3/L4 area, decreased thoracic and R lumbar mobility, decreased B hip extension ROM, decreased trunk and B glute med and max strength, decreased B lumbopelvic and femoral control, and difficulty performing standing tasks secondary to back pain. Pt will benefit from skilled physical therapy services to  address the aforementioned deficits.        OBJECTIVE IMPAIRMENTS: decreased ROM, decreased strength, improper body mechanics, postural dysfunction, and pain.   ACTIVITY LIMITATIONS: lifting, bending, standing, stairs, reach over head, and locomotion level  PARTICIPATION LIMITATIONS:   PERSONAL FACTORS: Fitness, Profession, Time since onset of injury/illness/exacerbation, and 3+ comorbidities: CHF, HTN, ORIF R ankle are also affecting patient's functional outcome.   REHAB POTENTIAL: Fair    CLINICAL DECISION MAKING: Evolving/moderate complexity; Pain seems to be worsening based on subjective reports.   EVALUATION COMPLEXITY: Moderate   GOALS: Goals reviewed with patient? Yes  SHORT TERM GOALS: Target date: 02/06/2024  Pt will be independent with her initial HEP to decrease back pain, improve strength, function, and ability to tolerated standing and perform standing tasks more comfortably.  Baseline: Pt has started her initial HEP (01/28/2024) Goal status: INITIAL   LONG TERM GOALS: Target date: 03/26/2024  Pt will have a decrease in low back pain to 3/10 or less at worst to promote ability to perform standing tasks, pick up items from the ground, as well as negotiate stairs more comfortably.  Baseline: Low back pain: 10/10 at most for the past 3 months. (01/28/2024) Goal status: INITIAL  2.  Pt will improve B glute max and glute med strength by at least 1/2 MMT grade to promote ability to perform standing tasks, ambulate, negotiate with less back pain.  Baseline:  MMT Right eval Left eval  Hip extension 4- 3+  Hip abduction 4- 3-   Goal status: INITIAL  3.  Pt will improve B hip extension PROM by at least 10 degrees to promote ability to perform standing tasks such as chores more comfortably for her back.  Baseline:  Passive  Right eval Left eval  Hip extension -7  -5   Goal status: INITIAL  4.  Pt will improve her Modified Oswestry Low Back Pain Disability  Questionnaire score by at least 10% as a demonstration of improved function.  Baseline: 13/50 (26%) (01/28/2024) Goal status: INITIAL   PLAN:  PT FREQUENCY: 1-2x/week  PT DURATION: 8 weeks  PLANNED INTERVENTIONS: 97110-Therapeutic exercises, 97530- Therapeutic activity, 97112- Neuromuscular re-education, 97535- Self Care, [id number]- Manual therapy, [phone]- Aquatic Therapy, [phone]- Electrical stimulation (unattended), [phone]- Traction (mechanical), [id number]- Ionotophoresis 4mg /ml Dexamethasone , 79439 (1-2 muscles), 20561 (3+ muscles)- Dry Needling, Patient/Family education, Joint mobilization, Spinal manipulation, and Spinal mobilization.  PLAN FOR NEXT SESSION: thoracic and hip extension, thoracic and lumbar mobility, scapular, trunk, and glute med and max strengthening, lumbopelvic and femoral control, manual techniques, modalities  PRN   Osborne Serio, PT, [name] 01/28/2024, 4:45 PM

## 2024-02-02 ENCOUNTER — Ambulatory Visit

## 2024-02-02 DIAGNOSIS — M5459 Other low back pain: Secondary | ICD-10-CM

## 2024-02-02 DIAGNOSIS — M25651 Stiffness of right hip, not elsewhere classified: Secondary | ICD-10-CM

## 2024-02-02 DIAGNOSIS — M25652 Stiffness of left hip, not elsewhere classified: Secondary | ICD-10-CM

## 2024-02-02 NOTE — Therapy (Signed)
 OUTPATIENT PHYSICAL THERAPY THORACOLUMBAR TREATMENT   Patient Name: Sara Reed MRN: [id number] DOB:Sep 25, 1960, 63 y.o., female Today's Date: 02/02/2024  END OF SESSION:  PT End of Session - 02/02/24 1708     Visit Number 2    Number of Visits 17    Date for Recertification  03/26/24    PT Start Time 1508    PT Stop Time 1550    PT Time Calculation (min) 42 min    Activity Tolerance Patient tolerated treatment well    Behavior During Therapy WFL for tasks assessed/performed           Past Medical History:  Diagnosis Date   Anemia    CHF (congestive heart failure) (HCC)    Hypertension    Past Surgical History:  Procedure Laterality Date   ORIF ANKLE FRACTURE Right 06/04/2019   Procedure: OPEN REDUCTION INTERNAL FIXATION WITH DISTAL FIBULA PLATE AND SYNDESMOTIC REPAIR OF RIGHT ANKLE.;  Surgeon: Mardee Lynwood SQUIBB, MD;  Location: [facility] ORS;  Service: Orthopedics;  Laterality: Right;   Patient Active Problem List   Diagnosis Date Noted   Acute CHF (HCC) 02/13/2022   Hypertension    Myocardial injury    Hypertensive urgency    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 05/03/2020   Acute respiratory failure with hypoxemia (HCC) 06/22/2017    PCP: Tobie Domino, MD   REFERRING PROVIDER: Domino Tobie, MD  REFERRING DIAG: Diagnosis M54.50 (ICD-10-CM) - Low back pain  Rationale for Evaluation and Treatment: Rehabilitation  THERAPY DIAG:  Other low back pain  Stiffness of right hip, not elsewhere classified  Stiffness of left hip, not elsewhere classified  ONSET DATE: 12/23/2023 (date PT referral signed)  SUBJECTIVE:                                                                                                                                                                                           SUBJECTIVE STATEMENT: Low back pain: 10/10 at most for the past 3 months.   PERTINENT HISTORY:  Low back pain. Pain started a  while ago (some months ago), gradual onset. Has been using a heating pad and lidocaine  patches. Was told she had tight muscles. Has not yet had imaging for her back.    No latex allergies.   PAIN:  Are you having pain? Yes: NPRS scale: 10/10 currently (pt sitting) Pain location: low back  Pain description: sore, tight, stiff.  Aggravating factors: standing for a long period of time about 45 min or less, standing to do chores. Picking up something from the ground. Going up and down steps bothers her back  a little.  Possibly lifting.   Relieving factors: heating pad, lidocaine  patch, deep tissue massage, bending over/leaning over  PRECAUTIONS: No known precautions.  RED FLAGS: Bowel or bladder incontinence: No and Cauda equina syndrome: No   WEIGHT BEARING RESTRICTIONS: No  FALLS:  Has patient fallen in last 6 months? No  LIVING ENVIRONMENT: Lives with: lives with their family and grandchildren Lives in: House/apartment Stairs: No Has following equipment at home: None  OCCUPATION: Working, going to retire next February. Pt is a custodian and cleans all day long.   PLOF: Independent  PATIENT GOALS: be able to stand more comfortably so she can fold laundry on her bed (waist high bed)  NEXT MD VISIT: none yet.   OBJECTIVE:  Note: Objective measures were completed at Evaluation unless otherwise noted.  DIAGNOSTIC FINDINGS:    PATIENT SURVEYS:  Modified Oswestry:  MODIFIED OSWESTRY DISABILITY SCALE  Date: 01/28/2024 Score  Pain intensity 1 = The pain is bad, but I can manage without having to take (1) I can stand as long as I want but, it increases my pain. pain medication.  2. Personal care (washing, dressing, etc.) 0 =  I can take care of myself normally without causing increased pain.  3. Lifting 4 = I can lift only very light weights  4. Walking 0 = Pain does not prevent me from walking any distance  5. Sitting 0 =  I can sit in any chair as long as I like.  6. Standing  2 =  Pain prevents me from standing more than 1 hour  7. Sleeping 1 = I can sleep well only by using pain medication.  8. Social Life 2 = Pain prevents me from participating in more energetic activities (eg. sports, dancing).  9. Traveling 0 =  I can travel anywhere without increased pain.  10. Employment/ Homemaking 3 = Pain prevents me from doing anything but light duties.  Total 13/50 (26%)   Interpretation of scores: Score Category Description  0-20% Minimal Disability The patient can cope with most living activities. Usually no treatment is indicated apart from advice on lifting, sitting and exercise  21-40% Moderate Disability The patient experiences more pain and difficulty with sitting, lifting and standing. Travel and social life are more difficult and they may be disabled from work. Personal care, sexual activity and sleeping are not grossly affected, and the patient can usually be managed by conservative means  41-60% Severe Disability Pain remains the main problem in this group, but activities of daily living are affected. These patients require a detailed investigation  61-80% Crippled Back pain impinges on all aspects of the patient's life. Positive intervention is required  81-100% Bed-bound  These patients are either bed-bound or exaggerating their symptoms  Bluford FORBES Zoe DELENA Karon DELENA, et al. Surgery versus conservative management of stable thoracolumbar fracture: the PRESTO feasibility RCT. [place] ([place]): [employer]; 2021 Nov. [facility] Technology Assessment, No. 25.62.) Appendix 3, Oswestry Disability Index category descriptors. Available from: Find[employer]el[employer]cz  Minimally Clinically Important Difference (MCID) = 12.8%  COGNITION: Overall cognitive status: Within functional limits for tasks assessed     SENSATION: WFL  MUSCLE LENGTH:   POSTURE: L cervical side bend, B protracted shoulders, R shoulder higher, R lateral shift,  Movement preference around L3/4 area, L trunk rotation, L lumbar convexity. R knee higher, slight R foot pronation  Decreased pain with manual L to R pressure to decrease L lumbar convexity  Increased lumbar lordosis around L3/4 area  PALPATION: TTP to low back area around L3/4 TTP R > L L5, L4, L3 TP Decreased R UPA to L5, L4, L3 TP Muscle tension R lumbar paraspinal muscles Decreased CPA to mid thoracic spine.     LUMBAR ROM:   AROM eval  Flexion WFL, decreased back pain, hinging at L3/4 area  Extension [facility] with pain and hinging at L3/4 area  Right lateral flexion WFL with low back pain  Left lateral flexion WFL with low back pain  Right rotation [facility] with low back pain  Left rotation WFL with low back pain.    (Blank rows = not tested)  LOWER EXTREMITY ROM:     Passive  Right eval Left eval  Hip flexion    Hip extension -7  -5  Hip abduction    Hip adduction    Hip internal rotation 32 24  Hip external rotation    Knee flexion    Knee extension    Ankle dorsiflexion    Ankle plantarflexion    Ankle inversion    Ankle eversion     (Blank rows = not tested)  LOWER EXTREMITY MMT:    MMT Right eval Left eval  Hip flexion 4 4  Hip extension 4- 3+  Hip abduction 4- 3-  Hip adduction    Hip internal rotation    Hip external rotation    Knee flexion 5 4  Knee extension 5 5  Ankle dorsiflexion    Ankle plantarflexion    Ankle inversion    Ankle eversion     (Blank rows = not tested)  LUMBAR SPECIAL TESTS:  (-) repeated flexion test with decreased back pain reported     FUNCTIONAL TESTS:    GAIT: Distance walked: 30 ft Assistive device utilized: None Level of assistance: Complete Independence Comments: increased L trunk rotation during R LE swing phase Decreased stance R LE during stance phase Increased lumbar rotation around L3/4  Stairs with B UE assist   Genu valgus, with contralateral pelvic drop and ipsilateral lumbar side bend  compensation due to glute and trunk weakness.   TREATMENT DATE: 02/02/24                                                                                                                                Subjective: Patient reports 5/10 pain in her low back.  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    Therapeutic exercise    Lower trunk rotation x 10 each side   Therapist assisted piriformis stretch 2 x 30 seconds each LE   Therapist assisted SKTC x 30 seconds each LE  Sidelying open book thoracic rotation x 10 each side    Supine marching with BTB around knees 2 x 10 - cues for core activation   Supine hip abduction with BTB around knees 2 x 10 - cues for core activation   Seated marching with BTB  around knees 2 x 10    Seated piriformis stretch 2 x 30 seconds each LE   Updated HEP with the below exercises and provided with blue theraband  PATIENT EDUCATION:  Education details: there-ex, HEP, POC Person educated: Patient Education method: Explanation, Demonstration, Tactile cues, Verbal cues, and Handouts Education comprehension: verbalized understanding and returned demonstration  HOME EXERCISE PROGRAM: Access Code: [email or web address] URL: [email or web address] Date: 02/02/2024 Prepared by: Maryanne Finder  Exercises - Supine Lower Trunk Rotation  - 2-3 x daily - 5-7 x weekly - 1-2 sets - 10 reps - 3-5 second  hold - Sidelying Thoracic Rotation with Open Book  - 2-3 x daily - 5-7 x weekly - 1-2 sets - 10 reps - 3-5 second  hold - Seated Piriformis Stretch  - 2-3 x daily - 5-7 x weekly - 3-5 reps - 30-60 seconds hold - Supine March with Resistance Band  - 1-2 x daily - 5-7 x weekly - 3 sets - 10 reps - Hooklying Clamshell with Resistance  - 1-2 x daily - 5-7 x weekly - 3 sets - 10 reps - Seated March with Resistance  - 1-2 x daily - 5-7 x weekly - 3 sets - 10 reps  ASSESSMENT:  CLINICAL IMPRESSION:   Continued PT POC regarding low back pain. Session focused  on lumbar/thoracic stretching/mobility and core activation. Updated HEP with the above exercises and provided with blue theraband. Education provided throughout on potential causes of low back pain and how this therapist initially addresses this, patient verbalized understanding. Patient reports decrease in pain at end of session to 2-3/10 in low back. Patient will continue to benefit from skilled therapy to address remaining deficits in order to improve quality of life and return to PLOF.    OBJECTIVE IMPAIRMENTS: decreased ROM, decreased strength, improper body mechanics, postural dysfunction, and pain.   ACTIVITY LIMITATIONS: lifting, bending, standing, stairs, reach over head, and locomotion level  PARTICIPATION LIMITATIONS:   PERSONAL FACTORS: Fitness, Profession, Time since onset of injury/illness/exacerbation, and 3+ comorbidities: CHF, HTN, ORIF R ankle are also affecting patient's functional outcome.   REHAB POTENTIAL: Fair    CLINICAL DECISION MAKING: Evolving/moderate complexity; Pain seems to be worsening based on subjective reports.   EVALUATION COMPLEXITY: Moderate   GOALS: Goals reviewed with patient? Yes  SHORT TERM GOALS: Target date: 02/06/2024  Pt will be independent with her initial HEP to decrease back pain, improve strength, function, and ability to tolerated standing and perform standing tasks more comfortably.  Baseline: Pt has started her initial HEP (01/28/2024) Goal status: INITIAL   LONG TERM GOALS: Target date: 03/26/2024  Pt will have a decrease in low back pain to 3/10 or less at worst to promote ability to perform standing tasks, pick up items from the ground, as well as negotiate stairs more comfortably.  Baseline: Low back pain: 10/10 at most for the past 3 months. (01/28/2024) Goal status: INITIAL  2.  Pt will improve B glute max and glute med strength by at least 1/2 MMT grade to promote ability to perform standing tasks, ambulate, negotiate with  less back pain.  Baseline:  MMT Right eval Left eval  Hip extension 4- 3+  Hip abduction 4- 3-   Goal status: INITIAL  3.  Pt will improve B hip extension PROM by at least 10 degrees to promote ability to perform standing tasks such as chores more comfortably for her back.  Baseline:  Passive  Right eval Left eval  Hip extension -7  -  5   Goal status: INITIAL  4.  Pt will improve her Modified Oswestry Low Back Pain Disability Questionnaire score by at least 10% as a demonstration of improved function.  Baseline: 13/50 (26%) (01/28/2024) Goal status: INITIAL   PLAN:  PT FREQUENCY: 1-2x/week  PT DURATION: 8 weeks  PLANNED INTERVENTIONS: 97110-Therapeutic exercises, 97530- Therapeutic activity, 97112- Neuromuscular re-education, 97535- Self Care, [id number]- Manual therapy, [phone]- Aquatic Therapy, [phone]- Electrical stimulation (unattended), [phone]- Traction (mechanical), [id number]- Ionotophoresis 4mg /ml Dexamethasone , 79439 (1-2 muscles), 20561 (3+ muscles)- Dry Needling, Patient/Family education, Joint mobilization, Spinal manipulation, and Spinal mobilization.  PLAN FOR NEXT SESSION: thoracic and hip extension, thoracic and lumbar mobility, scapular, trunk, and glute med and max strengthening, lumbopelvic and femoral control, manual techniques, modalities PRN   Maryanne Finder, PT, [name] Physical Therapist - [facility] 02/02/2024, 5:09 PM

## 2024-02-03 ENCOUNTER — Ambulatory Visit

## 2024-02-04 ENCOUNTER — Ambulatory Visit

## 2024-02-05 ENCOUNTER — Ambulatory Visit

## 2024-02-05 NOTE — Therapy (Incomplete)
 OUTPATIENT PHYSICAL THERAPY THORACOLUMBAR TREATMENT   Patient Name: Sara Reed MRN: [id number] DOB:Jun 30, 1960, 63 y.o., female Today's Date: 02/05/2024  END OF SESSION:     Past Medical History:  Diagnosis Date   Anemia    CHF (congestive heart failure) (HCC)    Hypertension    Past Surgical History:  Procedure Laterality Date   ORIF ANKLE FRACTURE Right 06/04/2019   Procedure: OPEN REDUCTION INTERNAL FIXATION WITH DISTAL FIBULA PLATE AND SYNDESMOTIC REPAIR OF RIGHT ANKLE.;  Surgeon: Mardee Lynwood SQUIBB, MD;  Location: [facility] ORS;  Service: Orthopedics;  Laterality: Right;   Patient Active Problem List   Diagnosis Date Noted   Acute CHF (HCC) 02/13/2022   Hypertension    Myocardial injury    Hypertensive urgency    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 05/03/2020   Acute respiratory failure with hypoxemia (HCC) 06/22/2017    PCP: Tobie Domino, MD   REFERRING PROVIDER: Domino Tobie, MD  REFERRING DIAG: Diagnosis M54.50 (ICD-10-CM) - Low back pain  Rationale for Evaluation and Treatment: Rehabilitation  THERAPY DIAG:  Other low back pain  Stiffness of right hip, not elsewhere classified  Stiffness of left hip, not elsewhere classified  ONSET DATE: 12/23/2023 (date PT referral signed)  SUBJECTIVE:                                                                                                                                                                                           SUBJECTIVE STATEMENT: Low back pain: 10/10 at most for the past 3 months.   PERTINENT HISTORY:  Low back pain. Pain started a while ago (some months ago), gradual onset. Has been using a heating pad and lidocaine  patches. Was told she had tight muscles. Has not yet had imaging for her back.    No latex allergies.   PAIN:  Are you having pain? Yes: NPRS scale: 10/10 currently (pt sitting) Pain location: low back  Pain description: sore, tight,  stiff.  Aggravating factors: standing for a long period of time about 45 min or less, standing to do chores. Picking up something from the ground. Going up and down steps bothers her back a little.  Possibly lifting.   Relieving factors: heating pad, lidocaine  patch, deep tissue massage, bending over/leaning over  PRECAUTIONS: No known precautions.  RED FLAGS: Bowel or bladder incontinence: No and Cauda equina syndrome: No   WEIGHT BEARING RESTRICTIONS: No  FALLS:  Has patient fallen in last 6 months? No  LIVING ENVIRONMENT: Lives with: lives with their family and grandchildren Lives in: House/apartment Stairs: No Has following equipment at home: None  OCCUPATION:  Working, going to retire next February. Pt is a custodian and cleans all day long.   PLOF: Independent  PATIENT GOALS: be able to stand more comfortably so she can fold laundry on her bed (waist high bed)  NEXT MD VISIT: none yet.   OBJECTIVE:  Note: Objective measures were completed at Evaluation unless otherwise noted.  DIAGNOSTIC FINDINGS:    PATIENT SURVEYS:  Modified Oswestry:  MODIFIED OSWESTRY DISABILITY SCALE  Date: 01/28/2024 Score  Pain intensity 1 = The pain is bad, but I can manage without having to take (1) I can stand as long as I want but, it increases my pain. pain medication.  2. Personal care (washing, dressing, etc.) 0 =  I can take care of myself normally without causing increased pain.  3. Lifting 4 = I can lift only very light weights  4. Walking 0 = Pain does not prevent me from walking any distance  5. Sitting 0 =  I can sit in any chair as long as I like.  6. Standing 2 =  Pain prevents me from standing more than 1 hour  7. Sleeping 1 = I can sleep well only by using pain medication.  8. Social Life 2 = Pain prevents me from participating in more energetic activities (eg. sports, dancing).  9. Traveling 0 =  I can travel anywhere without increased pain.  10. Employment/ Homemaking 3 =  Pain prevents me from doing anything but light duties.  Total 13/50 (26%)   Interpretation of scores: Score Category Description  0-20% Minimal Disability The patient can cope with most living activities. Usually no treatment is indicated apart from advice on lifting, sitting and exercise  21-40% Moderate Disability The patient experiences more pain and difficulty with sitting, lifting and standing. Travel and social life are more difficult and they may be disabled from work. Personal care, sexual activity and sleeping are not grossly affected, and the patient can usually be managed by conservative means  41-60% Severe Disability Pain remains the main problem in this group, but activities of daily living are affected. These patients require a detailed investigation  61-80% Crippled Back pain impinges on all aspects of the patient's life. Positive intervention is required  81-100% Bed-bound  These patients are either bed-bound or exaggerating their symptoms  Bluford FORBES Zoe DELENA Karon DELENA, et al. Surgery versus conservative management of stable thoracolumbar fracture: the PRESTO feasibility RCT. [place] ([place]): [employer]; 2021 Nov. [facility] Technology Assessment, No. 25.62.) Appendix 3, Oswestry Disability Index category descriptors. Available from: FindJewel[employer].cz  Minimally Clinically Important Difference (MCID) = 12.8%  COGNITION: Overall cognitive status: Within functional limits for tasks assessed     SENSATION: WFL  MUSCLE LENGTH:   POSTURE: L cervical side bend, B protracted shoulders, R shoulder higher, R lateral shift, Movement preference around L3/4 area, L trunk rotation, L lumbar convexity. R knee higher, slight R foot pronation  Decreased pain with manual L to R pressure to decrease L lumbar convexity  Increased lumbar lordosis around L3/4 area  PALPATION: TTP to low back area around L3/4 TTP R > L L5, L4, L3 TP Decreased R UPA to  L5, L4, L3 TP Muscle tension R lumbar paraspinal muscles Decreased CPA to mid thoracic spine.     LUMBAR ROM:   AROM eval  Flexion WFL, decreased back pain, hinging at L3/4 area  Extension [facility] with pain and hinging at L3/4 area  Right lateral flexion WFL with low back pain  Left lateral  flexion WFL with low back pain  Right rotation WFL with low back pain  Left rotation WFL with low back pain.    (Blank rows = not tested)  LOWER EXTREMITY ROM:     Passive  Right eval Left eval  Hip flexion    Hip extension -7  -5  Hip abduction    Hip adduction    Hip internal rotation 32 24  Hip external rotation    Knee flexion    Knee extension    Ankle dorsiflexion    Ankle plantarflexion    Ankle inversion    Ankle eversion     (Blank rows = not tested)  LOWER EXTREMITY MMT:    MMT Right eval Left eval  Hip flexion 4 4  Hip extension 4- 3+  Hip abduction 4- 3-  Hip adduction    Hip internal rotation    Hip external rotation    Knee flexion 5 4  Knee extension 5 5  Ankle dorsiflexion    Ankle plantarflexion    Ankle inversion    Ankle eversion     (Blank rows = not tested)  LUMBAR SPECIAL TESTS:  (-) repeated flexion test with decreased back pain reported     FUNCTIONAL TESTS:    GAIT: Distance walked: 30 ft Assistive device utilized: None Level of assistance: Complete Independence Comments: increased L trunk rotation during R LE swing phase Decreased stance R LE during stance phase Increased lumbar rotation around L3/4  Stairs with B UE assist   Genu valgus, with contralateral pelvic drop and ipsilateral lumbar side bend compensation due to glute and trunk weakness.   TREATMENT DATE: 02/05/24                                                                                                                               ***  Subjective: Patient reports 5/10 pain in her low back.  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    Therapeutic  exercise    Lower trunk rotation x 10 each side   Therapist assisted piriformis stretch 2 x 30 seconds each LE   Therapist assisted SKTC x 30 seconds each LE  Sidelying open book thoracic rotation x 10 each side    Supine marching with BTB around knees 2 x 10 - cues for core activation   Supine hip abduction with BTB around knees 2 x 10 - cues for core activation   Seated marching with BTB around knees 2 x 10    Seated piriformis stretch 2 x 30 seconds each LE   Updated HEP with the below exercises and provided with blue theraband  PATIENT EDUCATION:  Education details: there-ex, HEP, POC Person educated: Patient Education method: Explanation, Demonstration, Tactile cues, Verbal cues, and Handouts Education comprehension: verbalized understanding and returned demonstration  HOME EXERCISE PROGRAM: Access Code: [email or web address] URL: [email or web address] Date: 02/02/2024 Prepared by: Maryanne Finder  Exercises -  Supine Lower Trunk Rotation  - 2-3 x daily - 5-7 x weekly - 1-2 sets - 10 reps - 3-5 second  hold - Sidelying Thoracic Rotation with Open Book  - 2-3 x daily - 5-7 x weekly - 1-2 sets - 10 reps - 3-5 second  hold - Seated Piriformis Stretch  - 2-3 x daily - 5-7 x weekly - 3-5 reps - 30-60 seconds hold - Supine March with Resistance Band  - 1-2 x daily - 5-7 x weekly - 3 sets - 10 reps - Hooklying Clamshell with Resistance  - 1-2 x daily - 5-7 x weekly - 3 sets - 10 reps - Seated March with Resistance  - 1-2 x daily - 5-7 x weekly - 3 sets - 10 reps  ASSESSMENT:  CLINICAL IMPRESSION:  ***  Continued PT POC regarding low back pain. Session focused on lumbar/thoracic stretching/mobility and core activation. Updated HEP with the above exercises and provided with blue theraband. Education provided throughout on potential causes of low back pain and how this therapist initially addresses this, patient verbalized understanding. Patient reports decrease in pain at end of  session to 2-3/10 in low back. Patient will continue to benefit from skilled therapy to address remaining deficits in order to improve quality of life and return to PLOF.    OBJECTIVE IMPAIRMENTS: decreased ROM, decreased strength, improper body mechanics, postural dysfunction, and pain.   ACTIVITY LIMITATIONS: lifting, bending, standing, stairs, reach over head, and locomotion level  PARTICIPATION LIMITATIONS:   PERSONAL FACTORS: Fitness, Profession, Time since onset of injury/illness/exacerbation, and 3+ comorbidities: CHF, HTN, ORIF R ankle are also affecting patient's functional outcome.   REHAB POTENTIAL: Fair    CLINICAL DECISION MAKING: Evolving/moderate complexity; Pain seems to be worsening based on subjective reports.   EVALUATION COMPLEXITY: Moderate   GOALS: Goals reviewed with patient? Yes  SHORT TERM GOALS: Target date: 02/06/2024  Pt will be independent with her initial HEP to decrease back pain, improve strength, function, and ability to tolerated standing and perform standing tasks more comfortably.  Baseline: Pt has started her initial HEP (01/28/2024) Goal status: INITIAL   LONG TERM GOALS: Target date: 03/26/2024  Pt will have a decrease in low back pain to 3/10 or less at worst to promote ability to perform standing tasks, pick up items from the ground, as well as negotiate stairs more comfortably.  Baseline: Low back pain: 10/10 at most for the past 3 months. (01/28/2024) Goal status: INITIAL  2.  Pt will improve B glute max and glute med strength by at least 1/2 MMT grade to promote ability to perform standing tasks, ambulate, negotiate with less back pain.  Baseline:  MMT Right eval Left eval  Hip extension 4- 3+  Hip abduction 4- 3-   Goal status: INITIAL  3.  Pt will improve B hip extension PROM by at least 10 degrees to promote ability to perform standing tasks such as chores more comfortably for her back.  Baseline:  Passive  Right eval  Left eval  Hip extension -7  -5   Goal status: INITIAL  4.  Pt will improve her Modified Oswestry Low Back Pain Disability Questionnaire score by at least 10% as a demonstration of improved function.  Baseline: 13/50 (26%) (01/28/2024) Goal status: INITIAL   PLAN:  PT FREQUENCY: 1-2x/week  PT DURATION: 8 weeks  PLANNED INTERVENTIONS: 97110-Therapeutic exercises, 97530- Therapeutic activity, [id number]- Neuromuscular re-education, 97535- Self Care, [id number]- Manual therapy, [id number]- Aquatic Therapy, [id number]- Electrical stimulation (unattended), [id number]-  Traction (mechanical), 02966- Ionotophoresis 4mg /ml Dexamethasone , 79439 (1-2 muscles), 20561 (3+ muscles)- Dry Needling, Patient/Family education, Joint mobilization, Spinal manipulation, and Spinal mobilization.  PLAN FOR NEXT SESSION: thoracic and hip extension, thoracic and lumbar mobility, scapular, trunk, and glute med and max strengthening, lumbopelvic and femoral control, manual techniques, modalities PRN   Maryanne Finder, PT, [name] Physical Therapist - [facility] 02/05/2024, 4:34 PM

## 2024-02-09 ENCOUNTER — Ambulatory Visit

## 2024-02-09 DIAGNOSIS — M25652 Stiffness of left hip, not elsewhere classified: Secondary | ICD-10-CM

## 2024-02-09 DIAGNOSIS — M25651 Stiffness of right hip, not elsewhere classified: Secondary | ICD-10-CM

## 2024-02-09 DIAGNOSIS — M5459 Other low back pain: Secondary | ICD-10-CM

## 2024-02-11 ENCOUNTER — Encounter

## 2024-02-12 NOTE — Therapy (Incomplete)
 OUTPATIENT PHYSICAL THERAPY THORACOLUMBAR TREATMENT   Patient Name: Sara Reed MRN: [id number] DOB:[birth year], 63 y.o., female Today's Date: 02/12/2024  END OF SESSION:     Past Medical History:  Diagnosis Date   Anemia    CHF (congestive heart failure) (HCC)    Hypertension    Past Surgical History:  Procedure Laterality Date   ORIF ANKLE FRACTURE Right 06/04/2019   Procedure: OPEN REDUCTION INTERNAL FIXATION WITH DISTAL FIBULA PLATE AND SYNDESMOTIC REPAIR OF RIGHT ANKLE.;  Surgeon: Mardee Lynwood SQUIBB, MD;  Location: [facility] ORS;  Service: Orthopedics;  Laterality: Right;   Patient Active Problem List   Diagnosis Date Noted   Acute CHF (HCC) 02/13/2022   Hypertension    Myocardial injury    Hypertensive urgency    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 05/03/2020   Acute respiratory failure with hypoxemia (HCC) 06/22/2017    PCP: Tobie Domino, MD   REFERRING PROVIDER: Domino Tobie, MD  REFERRING DIAG: Diagnosis M54.50 (ICD-10-CM) - Low back pain  Rationale for Evaluation and Treatment: Rehabilitation  THERAPY DIAG:  Other low back pain  Stiffness of right hip, not elsewhere classified  Stiffness of left hip, not elsewhere classified  ONSET DATE: 12/23/2023 (date PT referral signed)  SUBJECTIVE:                                                                                                                                                                                           SUBJECTIVE STATEMENT: Low back pain: 10/10 at most for the past 3 months.   PERTINENT HISTORY:  Low back pain. Pain started a while ago (some months ago), gradual onset. Has been using a heating pad and lidocaine  patches. Was told she had tight muscles. Has not yet had imaging for her back.    No latex allergies.   PAIN:  Are you having pain? Yes: NPRS scale: 10/10 currently (pt sitting) Pain location: low back  Pain description: sore,  tight, stiff.  Aggravating factors: standing for a long period of time about 45 min or less, standing to do chores. Picking up something from the ground. Going up and down steps bothers her back a little.  Possibly lifting.   Relieving factors: heating pad, lidocaine  patch, deep tissue massage, bending over/leaning over  PRECAUTIONS: No known precautions.  RED FLAGS: Bowel or bladder incontinence: No and Cauda equina syndrome: No   WEIGHT BEARING RESTRICTIONS: No  FALLS:  Has patient fallen in last 6 months? No  LIVING ENVIRONMENT: Lives with: lives with their family and grandchildren Lives in: House/apartment Stairs: No Has following equipment at home: None  OCCUPATION:  Working, going to retire next February. Pt is a custodian and cleans all day long.   PLOF: Independent  PATIENT GOALS: be able to stand more comfortably so she can fold laundry on her bed (waist high bed)  NEXT MD VISIT: none yet.   OBJECTIVE:  Note: Objective measures were completed at Evaluation unless otherwise noted.  DIAGNOSTIC FINDINGS:    PATIENT SURVEYS:  Modified Oswestry:  MODIFIED OSWESTRY DISABILITY SCALE  Date: 01/28/2024 Score  Pain intensity 1 = The pain is bad, but I can manage without having to take (1) I can stand as long as I want but, it increases my pain. pain medication.  2. Personal care (washing, dressing, etc.) 0 =  I can take care of myself normally without causing increased pain.  3. Lifting 4 = I can lift only very light weights  4. Walking 0 = Pain does not prevent me from walking any distance  5. Sitting 0 =  I can sit in any chair as long as I like.  6. Standing 2 =  Pain prevents me from standing more than 1 hour  7. Sleeping 1 = I can sleep well only by using pain medication.  8. Social Life 2 = Pain prevents me from participating in more energetic activities (eg. sports, dancing).  9. Traveling 0 =  I can travel anywhere without increased pain.  10. Employment/  Homemaking 3 = Pain prevents me from doing anything but light duties.  Total 13/50 (26%)   Interpretation of scores: Score Category Description  0-20% Minimal Disability The patient can cope with most living activities. Usually no treatment is indicated apart from advice on lifting, sitting and exercise  21-40% Moderate Disability The patient experiences more pain and difficulty with sitting, lifting and standing. Travel and social life are more difficult and they may be disabled from work. Personal care, sexual activity and sleeping are not grossly affected, and the patient can usually be managed by conservative means  41-60% Severe Disability Pain remains the main problem in this group, but activities of daily living are affected. These patients require a detailed investigation  61-80% Crippled Back pain impinges on all aspects of the patient's life. Positive intervention is required  81-100% Bed-bound  These patients are either bed-bound or exaggerating their symptoms  Bluford FORBES Zoe DELENA Karon DELENA, et al. Surgery versus conservative management of stable thoracolumbar fracture: the PRESTO feasibility RCT. [place] ([place]): [employer]; 2021 Nov. [facility] Technology Assessment, No. 25.62.) Appendix 3, Oswestry Disability Index category descriptors. Available from: FindJewel[employer].cz  Minimally Clinically Important Difference (MCID) = 12.8%  COGNITION: Overall cognitive status: Within functional limits for tasks assessed     SENSATION: WFL  MUSCLE LENGTH:   POSTURE: L cervical side bend, B protracted shoulders, R shoulder higher, R lateral shift, Movement preference around L3/4 area, L trunk rotation, L lumbar convexity. R knee higher, slight R foot pronation  Decreased pain with manual L to R pressure to decrease L lumbar convexity  Increased lumbar lordosis around L3/4 area  PALPATION: TTP to low back area around L3/4 TTP R > L L5, L4, L3  TP Decreased R UPA to L5, L4, L3 TP Muscle tension R lumbar paraspinal muscles Decreased CPA to mid thoracic spine.     LUMBAR ROM:   AROM eval  Flexion WFL, decreased back pain, hinging at L3/4 area  Extension [facility] with pain and hinging at L3/4 area  Right lateral flexion WFL with low back pain  Left lateral  flexion WFL with low back pain  Right rotation WFL with low back pain  Left rotation WFL with low back pain.    (Blank rows = not tested)  LOWER EXTREMITY ROM:     Passive  Right eval Left eval  Hip flexion    Hip extension -7  -5  Hip abduction    Hip adduction    Hip internal rotation 32 24  Hip external rotation    Knee flexion    Knee extension    Ankle dorsiflexion    Ankle plantarflexion    Ankle inversion    Ankle eversion     (Blank rows = not tested)  LOWER EXTREMITY MMT:    MMT Right eval Left eval  Hip flexion 4 4  Hip extension 4- 3+  Hip abduction 4- 3-  Hip adduction    Hip internal rotation    Hip external rotation    Knee flexion 5 4  Knee extension 5 5  Ankle dorsiflexion    Ankle plantarflexion    Ankle inversion    Ankle eversion     (Blank rows = not tested)  LUMBAR SPECIAL TESTS:  (-) repeated flexion test with decreased back pain reported     FUNCTIONAL TESTS:    GAIT: Distance walked: 30 ft Assistive device utilized: None Level of assistance: Complete Independence Comments: increased L trunk rotation during R LE swing phase Decreased stance R LE during stance phase Increased lumbar rotation around L3/4  Stairs with B UE assist   Genu valgus, with contralateral pelvic drop and ipsilateral lumbar side bend compensation due to glute and trunk weakness.   TREATMENT DATE: 02/12/24                                                                                                                               ***  Subjective: Patient reports 5/10 pain in her low back.  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     Therapeutic exercise    Lower trunk rotation x 10 each side   Therapist assisted piriformis stretch 2 x 30 seconds each LE   Therapist assisted SKTC x 30 seconds each LE  Sidelying open book thoracic rotation x 10 each side    Supine marching with BTB around knees 2 x 10 - cues for core activation   Supine hip abduction with BTB around knees 2 x 10 - cues for core activation   Seated marching with BTB around knees 2 x 10    Seated piriformis stretch 2 x 30 seconds each LE   Updated HEP with the below exercises and provided with blue theraband  PATIENT EDUCATION:  Education details: there-ex, HEP, POC Person educated: Patient Education method: Explanation, Demonstration, Tactile cues, Verbal cues, and Handouts Education comprehension: verbalized understanding and returned demonstration  HOME EXERCISE PROGRAM: Access Code: [email or web address] URL: [email or web address] Date: 02/02/2024 Prepared by: Maryanne Finder  Exercises -  Supine Lower Trunk Rotation  - 2-3 x daily - 5-7 x weekly - 1-2 sets - 10 reps - 3-5 second  hold - Sidelying Thoracic Rotation with Open Book  - 2-3 x daily - 5-7 x weekly - 1-2 sets - 10 reps - 3-5 second  hold - Seated Piriformis Stretch  - 2-3 x daily - 5-7 x weekly - 3-5 reps - 30-60 seconds hold - Supine March with Resistance Band  - 1-2 x daily - 5-7 x weekly - 3 sets - 10 reps - Hooklying Clamshell with Resistance  - 1-2 x daily - 5-7 x weekly - 3 sets - 10 reps - Seated March with Resistance  - 1-2 x daily - 5-7 x weekly - 3 sets - 10 reps  ASSESSMENT:  CLINICAL IMPRESSION:  ***  Continued PT POC regarding low back pain. Session focused on lumbar/thoracic stretching/mobility and core activation. Updated HEP with the above exercises and provided with blue theraband. Education provided throughout on potential causes of low back pain and how this therapist initially addresses this, patient verbalized understanding. Patient reports decrease in  pain at end of session to 2-3/10 in low back. Patient will continue to benefit from skilled therapy to address remaining deficits in order to improve quality of life and return to PLOF.    OBJECTIVE IMPAIRMENTS: decreased ROM, decreased strength, improper body mechanics, postural dysfunction, and pain.   ACTIVITY LIMITATIONS: lifting, bending, standing, stairs, reach over head, and locomotion level  PARTICIPATION LIMITATIONS:   PERSONAL FACTORS: Fitness, Profession, Time since onset of injury/illness/exacerbation, and 3+ comorbidities: CHF, HTN, ORIF R ankle are also affecting patient's functional outcome.   REHAB POTENTIAL: Fair    CLINICAL DECISION MAKING: Evolving/moderate complexity; Pain seems to be worsening based on subjective reports.   EVALUATION COMPLEXITY: Moderate   GOALS: Goals reviewed with patient? Yes  SHORT TERM GOALS: Target date: 02/06/2024  Pt will be independent with her initial HEP to decrease back pain, improve strength, function, and ability to tolerated standing and perform standing tasks more comfortably.  Baseline: Pt has started her initial HEP (01/28/2024) Goal status: INITIAL   LONG TERM GOALS: Target date: 03/26/2024  Pt will have a decrease in low back pain to 3/10 or less at worst to promote ability to perform standing tasks, pick up items from the ground, as well as negotiate stairs more comfortably.  Baseline: Low back pain: 10/10 at most for the past 3 months. (01/28/2024) Goal status: INITIAL  2.  Pt will improve B glute max and glute med strength by at least 1/2 MMT grade to promote ability to perform standing tasks, ambulate, negotiate with less back pain.  Baseline:  MMT Right eval Left eval  Hip extension 4- 3+  Hip abduction 4- 3-   Goal status: INITIAL  3.  Pt will improve B hip extension PROM by at least 10 degrees to promote ability to perform standing tasks such as chores more comfortably for her back.  Baseline:  Passive   Right eval Left eval  Hip extension -7  -5   Goal status: INITIAL  4.  Pt will improve her Modified Oswestry Low Back Pain Disability Questionnaire score by at least 10% as a demonstration of improved function.  Baseline: 13/50 (26%) (01/28/2024) Goal status: INITIAL   PLAN:  PT FREQUENCY: 1-2x/week  PT DURATION: 8 weeks  PLANNED INTERVENTIONS: 97110-Therapeutic exercises, 97530- Therapeutic activity, [id number]- Neuromuscular re-education, 97535- Self Care, [id number]- Manual therapy, [id number]- Aquatic Therapy, [id number]- Electrical stimulation (unattended), [id number]-  Traction (mechanical), 02966- Ionotophoresis 4mg /ml Dexamethasone , 79439 (1-2 muscles), 20561 (3+ muscles)- Dry Needling, Patient/Family education, Joint mobilization, Spinal manipulation, and Spinal mobilization.  PLAN FOR NEXT SESSION: thoracic and hip extension, thoracic and lumbar mobility, scapular, trunk, and glute med and max strengthening, lumbopelvic and femoral control, manual techniques, modalities PRN   Maryanne Finder, PT, [name] Physical Therapist - [facility] 02/12/2024, 5:09 PM

## 2024-02-16 ENCOUNTER — Encounter

## 2024-02-16 ENCOUNTER — Ambulatory Visit

## 2024-02-16 DIAGNOSIS — M25651 Stiffness of right hip, not elsewhere classified: Secondary | ICD-10-CM

## 2024-02-16 DIAGNOSIS — M25652 Stiffness of left hip, not elsewhere classified: Secondary | ICD-10-CM

## 2024-02-16 DIAGNOSIS — M5459 Other low back pain: Secondary | ICD-10-CM

## 2024-02-18 ENCOUNTER — Encounter

## 2024-02-23 ENCOUNTER — Encounter

## 2024-02-23 ENCOUNTER — Ambulatory Visit

## 2024-02-25 ENCOUNTER — Encounter

## 2024-02-26 ENCOUNTER — Encounter

## 2024-02-26 ENCOUNTER — Other Ambulatory Visit: Payer: Self-pay | Admitting: Nephrology

## 2024-02-26 DIAGNOSIS — R8290 Unspecified abnormal findings in urine: Secondary | ICD-10-CM

## 2024-02-26 DIAGNOSIS — N1831 Chronic kidney disease, stage 3a: Secondary | ICD-10-CM

## 2024-03-01 ENCOUNTER — Encounter

## 2024-03-02 ENCOUNTER — Ambulatory Visit: Attending: Family Medicine

## 2024-03-02 DIAGNOSIS — M5459 Other low back pain: Secondary | ICD-10-CM | POA: Diagnosis present

## 2024-03-02 DIAGNOSIS — M25652 Stiffness of left hip, not elsewhere classified: Secondary | ICD-10-CM | POA: Diagnosis present

## 2024-03-02 DIAGNOSIS — M25651 Stiffness of right hip, not elsewhere classified: Secondary | ICD-10-CM | POA: Insufficient documentation

## 2024-03-02 NOTE — Therapy (Signed)
 OUTPATIENT PHYSICAL THERAPY THORACOLUMBAR TREATMENT   Patient Name: ITZABELLA SORRELS MRN: [id number] DOB:Jul 29, 1960, 63 y.o., female Today's Date: 03/02/2024  END OF SESSION:  PT End of Session - 03/02/24 1647     Visit Number 3    Number of Visits 17    Date for Recertification  03/26/24    Authorization - Visit Number 2    Authorization - Number of Visits 5    PT Start Time 1647    PT Stop Time 1726    PT Time Calculation (min) 39 min    Activity Tolerance Patient tolerated treatment well    Behavior During Therapy WFL for tasks assessed/performed            Past Medical History:  Diagnosis Date   Anemia    CHF (congestive heart failure) (HCC)    Hypertension    Past Surgical History:  Procedure Laterality Date   ORIF ANKLE FRACTURE Right 06/04/2019   Procedure: OPEN REDUCTION INTERNAL FIXATION WITH DISTAL FIBULA PLATE AND SYNDESMOTIC REPAIR OF RIGHT ANKLE.;  Surgeon: Mardee Lynwood SQUIBB, MD;  Location: [facility] ORS;  Service: Orthopedics;  Laterality: Right;   Patient Active Problem List   Diagnosis Date Noted   Acute CHF (HCC) 02/13/2022   Hypertension    Myocardial injury    Hypertensive urgency    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 05/03/2020   Acute respiratory failure with hypoxemia (HCC) 06/22/2017    PCP: Tobie Domino, MD   REFERRING PROVIDER: Domino Tobie, MD  REFERRING DIAG: Diagnosis M54.50 (ICD-10-CM) - Low back pain  Rationale for Evaluation and Treatment: Rehabilitation  THERAPY DIAG:  Other low back pain  Stiffness of right hip, not elsewhere classified  Stiffness of left hip, not elsewhere classified  ONSET DATE: 12/23/2023 (date PT referral signed)  SUBJECTIVE:                                                                                                                                                                                           SUBJECTIVE STATEMENT: Low back pain: 10/10 at most for  the past 3 months.   PERTINENT HISTORY:  Low back pain. Pain started a while ago (some months ago), gradual onset. Has been using a heating pad and lidocaine  patches. Was told she had tight muscles. Has not yet had imaging for her back.    No latex allergies.   PAIN:  Are you having pain? Yes: NPRS scale: 10/10 currently (pt sitting) Pain location: low back  Pain description: sore, tight, stiff.  Aggravating factors: standing for a long period of time about 45 min or less,  standing to do chores. Picking up something from the ground. Going up and down steps bothers her back a little.  Possibly lifting.   Relieving factors: heating pad, lidocaine  patch, deep tissue massage, bending over/leaning over  PRECAUTIONS: No known precautions.  RED FLAGS: Bowel or bladder incontinence: No and Cauda equina syndrome: No   WEIGHT BEARING RESTRICTIONS: No  FALLS:  Has patient fallen in last 6 months? No  LIVING ENVIRONMENT: Lives with: lives with their family and grandchildren Lives in: House/apartment Stairs: No Has following equipment at home: None  OCCUPATION: Working, going to retire next February. Pt is a custodian and cleans all day long.   PLOF: Independent  PATIENT GOALS: be able to stand more comfortably so she can fold laundry on her bed (waist high bed)  NEXT MD VISIT: none yet.   OBJECTIVE:  Note: Objective measures were completed at Evaluation unless otherwise noted.  DIAGNOSTIC FINDINGS:    PATIENT SURVEYS:  Modified Oswestry:  MODIFIED OSWESTRY DISABILITY SCALE  Date: 01/28/2024 Score  Pain intensity 1 = The pain is bad, but I can manage without having to take (1) I can stand as long as I want but, it increases my pain. pain medication.  2. Personal care (washing, dressing, etc.) 0 =  I can take care of myself normally without causing increased pain.  3. Lifting 4 = I can lift only very light weights  4. Walking 0 = Pain does not prevent me from walking any distance   5. Sitting 0 =  I can sit in any chair as long as I like.  6. Standing 2 =  Pain prevents me from standing more than 1 hour  7. Sleeping 1 = I can sleep well only by using pain medication.  8. Social Life 2 = Pain prevents me from participating in more energetic activities (eg. sports, dancing).  9. Traveling 0 =  I can travel anywhere without increased pain.  10. Employment/ Homemaking 3 = Pain prevents me from doing anything but light duties.  Total 13/50 (26%)   Interpretation of scores: Score Category Description  0-20% Minimal Disability The patient can cope with most living activities. Usually no treatment is indicated apart from advice on lifting, sitting and exercise  21-40% Moderate Disability The patient experiences more pain and difficulty with sitting, lifting and standing. Travel and social life are more difficult and they may be disabled from work. Personal care, sexual activity and sleeping are not grossly affected, and the patient can usually be managed by conservative means  41-60% Severe Disability Pain remains the main problem in this group, but activities of daily living are affected. These patients require a detailed investigation  61-80% Crippled Back pain impinges on all aspects of the patient's life. Positive intervention is required  81-100% Bed-bound  These patients are either bed-bound or exaggerating their symptoms  Bluford FORBES Zoe DELENA Karon DELENA, et al. Surgery versus conservative management of stable thoracolumbar fracture: the PRESTO feasibility RCT. [place] ([place]): [employer]; 2021 Nov. [facility] Technology Assessment, No. 25.62.) Appendix 3, Oswestry Disability Index category descriptors. Available from: Findjewelers.cz  Minimally Clinically Important Difference (MCID) = 12.8%  COGNITION: Overall cognitive status: Within functional limits for tasks assessed     SENSATION: WFL  MUSCLE LENGTH:   POSTURE: L cervical  side bend, B protracted shoulders, R shoulder higher, R lateral shift, Movement preference around L3/4 area, L trunk rotation, L lumbar convexity. R knee higher, slight R foot pronation  Decreased pain with  manual L to R pressure to decrease L lumbar convexity  Increased lumbar lordosis around L3/4 area  PALPATION: TTP to low back area around L3/4 TTP R > L L5, L4, L3 TP Decreased R UPA to L5, L4, L3 TP Muscle tension R lumbar paraspinal muscles Decreased CPA to mid thoracic spine.     LUMBAR ROM:   AROM eval  Flexion WFL, decreased back pain, hinging at L3/4 area  Extension [facility] with pain and hinging at L3/4 area  Right lateral flexion WFL with low back pain  Left lateral flexion WFL with low back pain  Right rotation [facility] with low back pain  Left rotation WFL with low back pain.    (Blank rows = not tested)  LOWER EXTREMITY ROM:     Passive  Right eval Left eval  Hip flexion    Hip extension -7  -5  Hip abduction    Hip adduction    Hip internal rotation 32 24  Hip external rotation    Knee flexion    Knee extension    Ankle dorsiflexion    Ankle plantarflexion    Ankle inversion    Ankle eversion     (Blank rows = not tested)  LOWER EXTREMITY MMT:    MMT Right eval Left eval  Hip flexion 4 4  Hip extension 4- 3+  Hip abduction 4- 3-  Hip adduction    Hip internal rotation    Hip external rotation    Knee flexion 5 4  Knee extension 5 5  Ankle dorsiflexion    Ankle plantarflexion    Ankle inversion    Ankle eversion     (Blank rows = not tested)  LUMBAR SPECIAL TESTS:  (-) repeated flexion test with decreased back pain reported     FUNCTIONAL TESTS:    GAIT: Distance walked: 30 ft Assistive device utilized: None Level of assistance: Complete Independence Comments: increased L trunk rotation during R LE swing phase Decreased stance R LE during stance phase Increased lumbar rotation around L3/4  Stairs with B UE assist   Genu valgus,  with contralateral pelvic drop and ipsilateral lumbar side bend compensation due to glute and trunk weakness.   TREATMENT DATE: 03/02/24                                                                                                                                Subjective: Patient reports a 7-8/10 NPS in her lower back. She reports she was cleaning a lot at [employer]. No questions or concerns.   Manual Therapy:  Light STM applied to lumbar paraspinal, QL (L > R) musculature for pain modulation and decrease muscle tension. Myofascial release, trigger point and contract and relax techniques utilized. Pt endorsed improvements in pain following intervention.   Therapeutic exercise   Sidelying Clamshell against resistance  R/L: 3 x 10 - Blue TB   Hooklying OH reach with TA Activation  3 x 10 - Weighted Dowel   Supine 90/90 with alternating LE extension   2 x 10 - 3 Kg MB   Lower trunk rotation x 10 each side   Neutral Curl Up - R Leg Straight   3 sets x 10s   Kettle Bell Squat 1 x 10 reps - 20# KB  Pt with narrow BoS, excessive forward trunk lean and bilateral hip IR/ADD.   - Improved with VC for proper squatting mechanics.    PATIENT EDUCATION:  Education details: there-ex, HEP, POC Person educated: Patient Education method: Explanation, Demonstration, Tactile cues, Verbal cues, and Handouts Education comprehension: verbalized understanding and returned demonstration  HOME EXERCISE PROGRAM: Access Code: [email or web address] URL: [email or web address] Date: 02/02/2024 Prepared by: Maryanne Finder  Exercises - Supine Lower Trunk Rotation  - 2-3 x daily - 5-7 x weekly - 1-2 sets - 10 reps - 3-5 second  hold - Sidelying Thoracic Rotation with Open Book  - 2-3 x daily - 5-7 x weekly - 1-2 sets - 10 reps - 3-5 second  hold - Seated Piriformis Stretch  - 2-3 x daily - 5-7 x weekly - 3-5 reps - 30-60 seconds hold - Supine March with Resistance Band  - 1-2 x daily - 5-7  x weekly - 3 sets - 10 reps - Hooklying Clamshell with Resistance  - 1-2 x daily - 5-7 x weekly - 3 sets - 10 reps - Seated March with Resistance  - 1-2 x daily - 5-7 x weekly - 3 sets - 10 reps  ASSESSMENT:  CLINICAL IMPRESSION:   Continued PT POC regarding low back pain. Session focused on improving core strengthening and lumbar mobility. Manual techniques used in order to decrease muscle tension in the L lumbar paraspinals. Improved pain in the lower back following PT interventions. Squatting form required multimodal cues for proper technique and reduction of lumbar shearing. TA activation in supine position successful following tactile cues. Patient will continue to benefit from skilled therapy to address remaining deficits in order to improve quality of life and return to PLOF.    OBJECTIVE IMPAIRMENTS: decreased ROM, decreased strength, improper body mechanics, postural dysfunction, and pain.   ACTIVITY LIMITATIONS: lifting, bending, standing, stairs, reach over head, and locomotion level  PARTICIPATION LIMITATIONS:   PERSONAL FACTORS: Fitness, Profession, Time since onset of injury/illness/exacerbation, and 3+ comorbidities: CHF, HTN, ORIF R ankle are also affecting patient's functional outcome.   REHAB POTENTIAL: Fair    CLINICAL DECISION MAKING: Evolving/moderate complexity; Pain seems to be worsening based on subjective reports.   EVALUATION COMPLEXITY: Moderate   GOALS: Goals reviewed with patient? Yes  SHORT TERM GOALS: Target date: 02/06/2024  Pt will be independent with her initial HEP to decrease back pain, improve strength, function, and ability to tolerated standing and perform standing tasks more comfortably.  Baseline: Pt has started her initial HEP (01/28/2024) Goal status: INITIAL   LONG TERM GOALS: Target date: 03/26/2024  Pt will have a decrease in low back pain to 3/10 or less at worst to promote ability to perform standing tasks, pick up items from the  ground, as well as negotiate stairs more comfortably.  Baseline: Low back pain: 10/10 at most for the past 3 months. (01/28/2024); 03/02/2024: 7/10 NPS  Goal status: INITIAL  2.  Pt will improve B glute max and glute med strength by at least 1/2 MMT grade to promote ability to perform standing tasks, ambulate, negotiate with less back pain.  Baseline:  MMT Right eval Left eval  Hip extension 4- 3+  Hip abduction 4- 3-   Goal status: INITIAL  3.  Pt will improve B hip extension PROM by at least 10 degrees to promote ability to perform standing tasks such as chores more comfortably for her back.  Baseline:  Passive  Right eval Left eval  Hip extension -7  -5   Goal status: INITIAL  4.  Pt will improve her Modified Oswestry Low Back Pain Disability Questionnaire score by at least 10% as a demonstration of improved function.  Baseline: 13/50 (26%) (01/28/2024) Goal status: INITIAL   PLAN:  PT FREQUENCY: 1-2x/week  PT DURATION: 8 weeks  PLANNED INTERVENTIONS: 97110-Therapeutic exercises, 97530- Therapeutic activity, 97112- Neuromuscular re-education, 97535- Self Care, 02859- Manual therapy, [phone]- Aquatic Therapy, [phone]- Electrical stimulation (unattended), [phone]- Traction (mechanical), [id number]- Ionotophoresis 4mg /ml Dexamethasone , 79439 (1-2 muscles), 20561 (3+ muscles)- Dry Needling, Patient/Family education, Joint mobilization, Spinal manipulation, and Spinal mobilization.  PLAN FOR NEXT SESSION: thoracic and hip extension, thoracic and lumbar mobility, scapular, trunk, and glute med and max strengthening, lumbopelvic and femoral control, manual techniques, modalities PRN   [name], [name] Physical Therapist- [facility] 03/02/2024, 6:32 PM

## 2024-03-03 ENCOUNTER — Encounter

## 2024-03-04 ENCOUNTER — Encounter

## 2024-03-04 ENCOUNTER — Ambulatory Visit
Admission: RE | Admit: 2024-03-04 | Discharge: 2024-03-04 | Disposition: A | Discharge status: Home / Self Care | Source: Ambulatory Visit | Attending: Nephrology | Admitting: Nephrology

## 2024-03-04 DIAGNOSIS — R8290 Unspecified abnormal findings in urine: Secondary | ICD-10-CM | POA: Diagnosis present

## 2024-03-04 DIAGNOSIS — N1831 Chronic kidney disease, stage 3a: Secondary | ICD-10-CM | POA: Diagnosis present

## 2024-03-07 ENCOUNTER — Other Ambulatory Visit: Payer: Self-pay

## 2024-03-07 ENCOUNTER — Emergency Department
Admission: EM | Admit: 2024-03-07 | Discharge: 2024-03-07 | Disposition: A | Discharge status: Home / Self Care | Attending: Emergency Medicine | Admitting: Emergency Medicine

## 2024-03-07 ENCOUNTER — Emergency Department

## 2024-03-07 DIAGNOSIS — I110 Hypertensive heart disease with heart failure: Secondary | ICD-10-CM | POA: Insufficient documentation

## 2024-03-07 DIAGNOSIS — J069 Acute upper respiratory infection, unspecified: Secondary | ICD-10-CM | POA: Diagnosis not present

## 2024-03-07 DIAGNOSIS — R059 Cough, unspecified: Secondary | ICD-10-CM | POA: Diagnosis present

## 2024-03-07 DIAGNOSIS — I509 Heart failure, unspecified: Secondary | ICD-10-CM | POA: Diagnosis not present

## 2024-03-07 LAB — RESP PANEL BY RT-PCR (RSV, FLU A&B, COVID)  RVPGX2
Influenza A by PCR: NEGATIVE
Influenza B by PCR: NEGATIVE
Resp Syncytial Virus by PCR: NEGATIVE
SARS Coronavirus 2 by RT PCR: NEGATIVE

## 2024-03-07 MED ORDER — GUAIFENESIN-CODEINE 100-10 MG/5ML PO SOLN: 10.0000 mL | Freq: Three times a day (TID) | ORAL | 0 refills | Status: AC | PRN

## 2024-03-07 NOTE — ED Triage Notes (Signed)
 Pt comes with cough, congestion for about 3 days. Pt denies any sore throat or fevers. Pt states productive cough with green phelgm

## 2024-03-07 NOTE — Discharge Instructions (Signed)
 Rest, take Tylenol  or ibuprofen  if needed for body aches, headache, sore throat, or fever.  The prescribed cough medication may make you sleepy or dizzy.  Do not drive or operate machinery for at least 6 to 8 hours after the last dose.  Follow-up with your primary care provider if you are not improving over the next few days.

## 2024-03-07 NOTE — ED Provider Notes (Signed)
   [facility] Provider Note    Event Date/Time   First MD Initiated Contact with Patient 03/07/24 1013     (approximate)   History   Cough   HPI  Sara Reed is a 63 y.o. female  with history of CHF, hypertension, anemia and as listed in EMR presents to the emergency department for treatment and evaluation of cough and congestion for 3 days. No fever, vomiting, or diarrhea.   Physical Exam    Vitals:   03/07/24 0952 03/07/24 1027  BP: (!) 155/98   Pulse: 78   Resp: 18   Temp: 98.3 F (36.8 C)   SpO2: 94% 94%    General: Awake, no distress.  CV:  Good peripheral perfusion.  Resp:  Normal effort. Breath sounds clear. Abd:  No distention.  Other:     ED Results / Procedures / Treatments   Labs (all labs ordered are listed, but only abnormal results are displayed)  Labs Reviewed  RESP PANEL BY RT-PCR (RSV, FLU A&B, COVID)  [id number]     EKG  Not indicated.   RADIOLOGY  Image and radiology report reviewed and interpreted by me. Radiology report consistent with the same.  Chest x-ray negative for concerns.  PROCEDURES:  Critical Care performed: No  Procedures   MEDICATIONS ORDERED IN ED:  Medications - No data to display   IMPRESSION / MDM / ASSESSMENT AND PLAN / ED COURSE   I have reviewed the triage note and vital signs. Vital signs are stable   Differential diagnosis includes, but is not limited to, URI, Covid, influenza, pneumonia  Patient's presentation is most consistent with acute illness / injury with system symptoms.  63 year old female presenting to the emergency department for treatment and evaluation of cough and congestion for about 3 days.  Cough is productive of phlegm.  No fever.  Respiratory panel is negative.  Chest x-ray is negative for acute concerns.  Plan will be to treat her symptomatically with Robitussin AC and have her follow-up with her primary care provider if not improving over the next  few days.      FINAL CLINICAL IMPRESSION(S) / ED DIAGNOSES   Final diagnoses:  Viral URI with cough     Rx / DC Orders   ED Discharge Orders          Ordered    guaiFENesin -codeine  100-10 MG/5ML syrup  3 times daily PRN        03/07/24 1103             Note:  This document was prepared using Dragon voice recognition software and may include unintentional dictation errors.   Herlinda Kirk NOVAK, FNP 03/07/24 1109    Bradler, Evan K, MD 03/07/24 1349

## 2024-03-08 ENCOUNTER — Encounter

## 2024-03-10 ENCOUNTER — Encounter

## 2024-03-11 ENCOUNTER — Ambulatory Visit

## 2024-03-24 ENCOUNTER — Ambulatory Visit

## 2024-03-31 ENCOUNTER — Ambulatory Visit

## 2024-03-31 DIAGNOSIS — M25652 Stiffness of left hip, not elsewhere classified: Secondary | ICD-10-CM | POA: Insufficient documentation

## 2024-03-31 DIAGNOSIS — M5459 Other low back pain: Secondary | ICD-10-CM | POA: Insufficient documentation

## 2024-03-31 DIAGNOSIS — M25651 Stiffness of right hip, not elsewhere classified: Secondary | ICD-10-CM | POA: Diagnosis present

## 2024-03-31 NOTE — Therapy (Signed)
 OUTPATIENT PHYSICAL THERAPY THORACOLUMBAR TREATMENT/RE CERTIFICATION   Patient Name: Sara Reed MRN: [id number] DOB:[birth year], 63 y.o., female Today's Date: 03/31/2024  END OF SESSION:  PT End of Session - 03/31/24 1604     Visit Number 4    Number of Visits 25    Date for Recertification  05/26/24    Authorization - Visit Number 3    Authorization - Number of Visits 5    PT Start Time 1604    PT Stop Time 1645    PT Time Calculation (min) 41 min    Activity Tolerance Patient tolerated treatment well    Behavior During Therapy WFL for tasks assessed/performed             Past Medical History:  Diagnosis Date   Anemia    CHF (congestive heart failure) (HCC)    Hypertension    Past Surgical History:  Procedure Laterality Date   ORIF ANKLE FRACTURE Right 06/04/2019   Procedure: OPEN REDUCTION INTERNAL FIXATION WITH DISTAL FIBULA PLATE AND SYNDESMOTIC REPAIR OF RIGHT ANKLE.;  Surgeon: Mardee Lynwood SQUIBB, MD;  Location: [facility] ORS;  Service: Orthopedics;  Laterality: Right;   Patient Active Problem List   Diagnosis Date Noted   Acute CHF (HCC) 02/13/2022   Hypertension    Myocardial injury    Hypertensive urgency    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 05/03/2020   Acute respiratory failure with hypoxemia (HCC) 06/22/2017    PCP: Tobie Domino, MD   REFERRING PROVIDER: Domino Tobie, MD  REFERRING DIAG: Diagnosis M54.50 (ICD-10-CM) - Low back pain  Rationale for Evaluation and Treatment: Rehabilitation  THERAPY DIAG:  Other low back pain  Stiffness of right hip, not elsewhere classified  Stiffness of left hip, not elsewhere classified  ONSET DATE: 12/23/2023 (date PT referral signed)  SUBJECTIVE:                                                                                                                                                                                           SUBJECTIVE STATEMENT: Low back  pain: 10/10 at most for the past 3 months.   PERTINENT HISTORY:  Low back pain. Pain started a while ago (some months ago), gradual onset. Has been using a heating pad and lidocaine  patches. Was told she had tight muscles. Has not yet had imaging for her back.    No latex allergies.   PAIN:  Are you having pain? Yes: NPRS scale: 10/10 currently (pt sitting) Pain location: low back  Pain description: sore, tight, stiff.  Aggravating factors: standing for a long period of time about 45 min  or less, standing to do chores. Picking up something from the ground. Going up and down steps bothers her back a little.  Possibly lifting.   Relieving factors: heating pad, lidocaine  patch, deep tissue massage, bending over/leaning over  PRECAUTIONS: No known precautions.  RED FLAGS: Bowel or bladder incontinence: No and Cauda equina syndrome: No   WEIGHT BEARING RESTRICTIONS: No  FALLS:  Has patient fallen in last 6 months? No  LIVING ENVIRONMENT: Lives with: lives with their family and grandchildren Lives in: House/apartment Stairs: No Has following equipment at home: None  OCCUPATION: Working, going to retire next February. Pt is a custodian and cleans all day long.   PLOF: Independent  PATIENT GOALS: be able to stand more comfortably so she can fold laundry on her bed (waist high bed)  NEXT MD VISIT: none yet.   OBJECTIVE:  Note: Objective measures were completed at Evaluation unless otherwise noted.  DIAGNOSTIC FINDINGS:    PATIENT SURVEYS:  Modified Oswestry:  MODIFIED OSWESTRY DISABILITY SCALE  Date: 01/28/2024 Score  Pain intensity 1 = The pain is bad, but I can manage without having to take (1) I can stand as long as I want but, it increases my pain. pain medication.  2. Personal care (washing, dressing, etc.) 0 =  I can take care of myself normally without causing increased pain.  3. Lifting 4 = I can lift only very light weights  4. Walking 0 = Pain does not prevent me  from walking any distance  5. Sitting 0 =  I can sit in any chair as long as I like.  6. Standing 2 =  Pain prevents me from standing more than 1 hour  7. Sleeping 1 = I can sleep well only by using pain medication.  8. Social Life 2 = Pain prevents me from participating in more energetic activities (eg. sports, dancing).  9. Traveling 0 =  I can travel anywhere without increased pain.  10. Employment/ Homemaking 3 = Pain prevents me from doing anything but light duties.  Total 13/50 (26%)   Interpretation of scores: Score Category Description  0-20% Minimal Disability The patient can cope with most living activities. Usually no treatment is indicated apart from advice on lifting, sitting and exercise  21-40% Moderate Disability The patient experiences more pain and difficulty with sitting, lifting and standing. Travel and social life are more difficult and they may be disabled from work. Personal care, sexual activity and sleeping are not grossly affected, and the patient can usually be managed by conservative means  41-60% Severe Disability Pain remains the main problem in this group, but activities of daily living are affected. These patients require a detailed investigation  61-80% Crippled Back pain impinges on all aspects of the patient's life. Positive intervention is required  81-100% Bed-bound  These patients are either bed-bound or exaggerating their symptoms  Bluford FORBES Zoe DELENA Karon DELENA, et al. Surgery versus conservative management of stable thoracolumbar fracture: the PRESTO feasibility RCT. [place] ([place]): [employer]; 2021 Nov. [facility] Technology Assessment, No. 25.62.) Appendix 3, Oswestry Disability Index category descriptors. Available from: [email or web address].cz  Minimally Clinically Important Difference (MCID) = 12.8%  COGNITION: Overall cognitive status: Within functional limits for tasks assessed     SENSATION: WFL  MUSCLE  LENGTH:   POSTURE: L cervical side bend, B protracted shoulders, R shoulder higher, R lateral shift, Movement preference around L3/4 area, L trunk rotation, L lumbar convexity. R knee higher, slight R foot pronation  Decreased  pain with manual L to R pressure to decrease L lumbar convexity  Increased lumbar lordosis around L3/4 area  PALPATION: TTP to low back area around L3/4 TTP R > L L5, L4, L3 TP Decreased R UPA to L5, L4, L3 TP Muscle tension R lumbar paraspinal muscles Decreased CPA to mid thoracic spine.     LUMBAR ROM:   AROM eval  Flexion WFL, decreased back pain, hinging at L3/4 area  [facility] with pain and hinging at L3/4 area  Right lateral flexion WFL with low back pain  Left lateral flexion WFL with low back pain  Right rotation [facility] with low back pain  Left rotation WFL with low back pain.    (Blank rows = not tested)  LOWER EXTREMITY ROM:     Passive  Right eval Left eval  Hip flexion    Hip extension -7  -5  Hip abduction    Hip adduction    Hip internal rotation 32 24  Hip external rotation    Knee flexion    Knee extension    Ankle dorsiflexion    Ankle plantarflexion    Ankle inversion    Ankle eversion     (Blank rows = not tested)  LOWER EXTREMITY MMT:    MMT Right eval Left eval  Hip flexion 4 4  Hip extension 4- 3+  Hip abduction 4- 3-  Hip adduction    Hip internal rotation    Hip external rotation    Knee flexion 5 4  Knee extension 5 5  Ankle dorsiflexion    Ankle plantarflexion    Ankle inversion    Ankle eversion     (Blank rows = not tested)  LUMBAR SPECIAL TESTS:  (-) repeated flexion test with decreased back pain reported     FUNCTIONAL TESTS:    GAIT: Distance walked: 30 ft Assistive device utilized: None Level of assistance: Complete Independence Comments: increased L trunk rotation during R LE swing phase Decreased stance R LE during stance phase Increased lumbar rotation around L3/4  Stairs  with B UE assist   Genu valgus, with contralateral pelvic drop and ipsilateral lumbar side bend compensation due to glute and trunk weakness.   TREATMENT DATE: 03/31/24                                                                                                                                Subjective: Patient reports a 8/10 NPS in her lower back.She reports that [employer] school had her doing lots of cleaning.  No questions or concerns.    MODIFIED OSWESTRY DISABILITY SCALE  Date: 03/31/2024 Score  Pain intensity 1 = The pain is bad, but I can manage without having to take pain medication  2. Personal care (washing, dressing, etc.) 1 =  I can take care of myself normally, but it increases my pain.  3. Lifting 3 = Pain prevents me from lifting heavy  weights, but I can manage light to medium weights if they are conveniently positioned  4. Walking 1 = Pain prevents me from walking more than 1 mile.  5. Sitting 2 =  Pain prevents me from sitting more than 1 hour.  6. Standing 2 =  Pain prevents me from standing more than 1 hour  7. Sleeping 5 =  Pain prevents me from sleeping at all.  8. Social Life 0 = My social life is normal and does not increase my pain.  9. Traveling 0 =  I can travel anywhere without increased pain.  10. Employment/ Homemaking 1 = My normal homemaking/job activities increase my pain, but I can still perform all that is required of me  Total 16/50   Interpretation of scores: Score Category Description  0-20% Minimal Disability The patient can cope with most living activities. Usually no treatment is indicated apart from advice on lifting, sitting and exercise  21-40% Moderate Disability The patient experiences more pain and difficulty with sitting, lifting and standing. Travel and social life are more difficult and they may be disabled from work. Personal care, sexual activity and sleeping are not grossly affected, and the patient can usually be managed by  conservative means  41-60% Severe Disability Pain remains the main problem in this group, but activities of daily living are affected. These patients require a detailed investigation  61-80% Crippled Back pain impinges on all aspects of the patient's life. Positive intervention is required  81-100% Bed-bound These patients are either bed-bound or exaggerating their symptoms  Bluford BRAVO, [place] DELENA Karon DELENA, et al. Surgery versus conservative management of s/table thoracolumbar fracture: the PRESTO feasibility RCT. [place] ([place]): [employer]; 2021 Nov. [facility] Technology Assessment, No. 25.62.) Appendix 3, Oswestry Disability Index category descriptors. Available from: Findjewelers.cz  Minimally Clinically Important Difference (MCID) = 12.8%    Therapeutic exercise   Supine 90/90 with alternating LE extension   3 x 10  Hooklying OH reach with TA Activation   3 x 10 - Weighted Dowel   Sidelying Clamshell against resistance  L: 3 x 10 - Blue TB   Sidelying Hip Abduction   R/L: 3 x 10 - Light PT resistance at the knee on last set  Neutral Curl Up - R Leg Straight   3 sets x 10s  Supine Thomas Test for Hip Flexor Stretch  30s/bout x 2 in order to improve ROM and tissue extensibility  - Notable tightness with iliopsoas muscle  Therapeutic Activity: NuStep L4-1 x 6 min x UE/LE (Seat 10) for trunk mobility and LE strength for walking ; PT manually adjusted resistance throughout per patient tolerance.   Kettle Bell Squat 1 x 10 reps - 20# KB 1 x 10 reps - 10# KB   Pt with narrow BoS, excessive forward trunk lean and bilateral hip IR/ADD.   - Improved with VC for proper squatting mechanics.    PATIENT EDUCATION:  Education details: Exercise Technique  Person educated: Patient Education method: Explanation, Demonstration, Tactile cues, Verbal cues, and Handouts Education comprehension: verbalized understanding and returned  demonstration  HOME EXERCISE PROGRAM: Access Code: [email or web address] URL: [email or web address] Date: 03/31/2024 Prepared by: Lonni Pall  Exercises - Supine Lower Trunk Rotation  - 2-3 x daily - 5-7 x weekly - 1-2 sets - 10 reps - 3-5 second  hold - Sidelying Thoracic Rotation with Open Book  - 2-3 x daily - 5-7 x weekly - 1-2 sets - 10 reps - 3-5 second  hold -  Seated Piriformis Stretch  - 2-3 x daily - 5-7 x weekly - 3-5 reps - 30-60 seconds hold - Supine March with Resistance Band  - 1-2 x daily - 5-7 x weekly - 3 sets - 10 reps - Hooklying Clamshell with Resistance  - 1-2 x daily - 5-7 x weekly - 3 sets - 10 reps - Seated March with Resistance  - 1-2 x daily - 5-7 x weekly - 3 sets - 10 reps - Mini Squat with Counter Support  - 1 x daily - 5-7 x weekly - 2-3 sets - 10 reps - Hip Flexor Stretch at Edge of Bed  - 1 x daily - 5-7 x weekly - 2-3 sets - 30s hold  Access Code: [email or web address] URL: [email or web address] Date: 02/02/2024 Prepared by: Maryanne Finder  Exercises - Supine Lower Trunk Rotation  - 2-3 x daily - 5-7 x weekly - 1-2 sets - 10 reps - 3-5 second  hold - Sidelying Thoracic Rotation with Open Book  - 2-3 x daily - 5-7 x weekly - 1-2 sets - 10 reps - 3-5 second  hold - Seated Piriformis Stretch  - 2-3 x daily - 5-7 x weekly - 3-5 reps - 30-60 seconds hold - Supine March with Resistance Band  - 1-2 x daily - 5-7 x weekly - 3 sets - 10 reps - Hooklying Clamshell with Resistance  - 1-2 x daily - 5-7 x weekly - 3 sets - 10 reps - Seated March with Resistance  - 1-2 x daily - 5-7 x weekly - 3 sets - 10 reps  ASSESSMENT:  CLINICAL IMPRESSION:   Continued PT POC regarding low back pain. Session focused on improving core strengthening and lumbar mobility. Manual techniques used in order to decrease muscle tension in the L lumbar paraspinals. Improved pain in the lower back following PT interventions. Squatting form required multimodal cues for proper technique  and reduction of lumbar shearing. (See goals below) - patient's pain has shown progress with adherence to HEP and PT interventions. Additionally she has shown improvements in gluteal strength. She still has limitations in glute extension strength and ROM; additional stretches (thomas test position) provided in order to reduce muscle tension in the hip flexor. Per self report on mODI in today's session; patient's pain has worsened her ability to perform functional tasks. (See above in Objective). Patient's condition has the potential to improve in response to therapy. Maximum improvement is yet to be obtained. The anticipated improvement is attainable and reasonable in a generally predictable time. PT requesting for 1x/week for an additional 8 weeks in order to address current deficits and maximize return to PLOF.    OBJECTIVE IMPAIRMENTS: decreased ROM, decreased strength, improper body mechanics, postural dysfunction, and pain.   ACTIVITY LIMITATIONS: lifting, bending, standing, stairs, reach over head, and locomotion level  PARTICIPATION LIMITATIONS:   PERSONAL FACTORS: Fitness, Profession, Time since onset of injury/illness/exacerbation, and 3+ comorbidities: CHF, HTN, ORIF R ankle are also affecting patient's functional outcome.   REHAB POTENTIAL: Fair    CLINICAL DECISION MAKING: Evolving/moderate complexity; Pain seems to be worsening based on subjective reports.   EVALUATION COMPLEXITY: Moderate   GOALS: Goals reviewed with patient? Yes  SHORT TERM GOALS: Target date: 02/06/2024  Pt will be independent with her initial HEP to decrease back pain, improve strength, function, and ability to tolerated standing and perform standing tasks more comfortably.  Baseline: Pt has started her initial HEP (01/28/2024); 03/31/2024: Patient endorses 75% adherence Goal status: Progressing  LONG TERM GOALS: Target date: 05/26/2024   Pt will have a decrease in low back pain to 3/10 or less at worst  to promote ability to perform standing tasks, pick up items from the ground, as well as negotiate stairs more comfortably.  Baseline: Low back pain: 10/10 at most for the past 3 months. (01/28/2024); 03/02/2024: 7/10 NPS  Goal status: Progressing  2.  Pt will improve B glute max and glute med strength by at least 1/2 MMT grade to promote ability to perform standing tasks, ambulate, negotiate with less back pain.  Baseline:  MMT Right eval Left eval R/L 03/31/24  Hip extension 4- 3+   Hip abduction 4- 3- 4+/4+   Goal status: INITIAL  3.  Pt will improve B hip extension PROM by at least 10 degrees to promote ability to perform standing tasks such as chores more comfortably for her back.  Baseline:  Passive  Right eval Left eval  Hip extension -7  -5   Goal status: INITIAL  4.  Pt will improve her Modified Oswestry Low Back Pain Disability Questionnaire score by at least 10% as a demonstration of improved function.  Baseline: 13/50 (26%) (01/28/2024); 03/31/2024: 16/50 Goal status: Progressing   PLAN:  PT FREQUENCY: 1-2x/week  PT DURATION: 8 weeks  PLANNED INTERVENTIONS: 97110-Therapeutic exercises, 97530- Therapeutic activity, 97112- Neuromuscular re-education, 97535- Self Care, [id number]- Manual therapy, [phone]- Aquatic Therapy, [phone]- Electrical stimulation (unattended), [phone]- Traction (mechanical), [id number]- Ionotophoresis 4mg /ml Dexamethasone , [id number] (1-2 muscles), [id number] (3+ muscles)- Dry Needling, Patient/Family education, Joint mobilization, Spinal manipulation, and Spinal mobilization.  PLAN FOR NEXT SESSION: thoracic and hip extension, thoracic and lumbar mobility, scapular, trunk, and glute med and max strengthening, lumbopelvic and femoral control, manual techniques, modalities PRN   [name], [name] Physical Therapist- [facility]   [facility] 03/31/2024, 4:16 PM

## 2024-04-14 ENCOUNTER — Ambulatory Visit

## 2024-04-19 ENCOUNTER — Ambulatory Visit

## 2024-04-19 ENCOUNTER — Telehealth: Payer: Self-pay

## 2024-04-19 NOTE — Telephone Encounter (Signed)
 Called patient about missed appointment. No answer and VM box is full. Patient discharged from schedule due to multiple no shows per [facility].   [name], [name] Physical Therapist- [place]

## 2024-04-19 NOTE — Therapy (Deleted)
 " OUTPATIENT PHYSICAL THERAPY THORACOLUMBAR TREATMENT/RE CERTIFICATION   Patient Name: Sara Reed MRN: [id number] DOB:1960-05-28, 63 y.o., female Today's Date: 04/19/2024  END OF SESSION:       Past Medical History:  Diagnosis Date   Anemia    CHF (congestive heart failure) (HCC)    Hypertension    Past Surgical History:  Procedure Laterality Date   ORIF ANKLE FRACTURE Right 06/04/2019   Procedure: OPEN REDUCTION INTERNAL FIXATION WITH DISTAL FIBULA PLATE AND SYNDESMOTIC REPAIR OF RIGHT ANKLE.;  Surgeon: Mardee Lynwood SQUIBB, MD;  Location: [facility] ORS;  Service: Orthopedics;  Laterality: Right;   Patient Active Problem List   Diagnosis Date Noted   Acute CHF (HCC) 02/13/2022   Hypertension    Myocardial injury    Hypertensive urgency    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  ONSET DATE: 12/23/2023 (date PT referral signed)  SUBJECTIVE:                                                                                                                                                                                           SUBJECTIVE STATEMENT: Low back pain: 10/10 at most for the past 3 months.   PERTINENT HISTORY:  Low back pain. Pain started a while ago (some months ago), gradual onset. Has been using a heating pad and lidocaine  patches. Was told she had tight muscles. Has not yet had imaging for her back.    No latex allergies.   PAIN:  Are you having pain? Yes: NPRS scale: 10/10 currently (pt sitting) Pain location: low back  Pain description: sore, tight, stiff.  Aggravating factors: standing for a long period of time about 45 min or  less, standing to do chores. Picking up something from the ground. Going up and down steps bothers her back a little.  Possibly lifting.   Relieving factors: heating pad, lidocaine  patch, deep tissue massage, bending over/leaning over  PRECAUTIONS: No known precautions.  RED FLAGS: Bowel or bladder incontinence: No and Cauda equina syndrome: No   WEIGHT BEARING RESTRICTIONS: No  FALLS:  Has patient fallen in last 6 months? No  LIVING ENVIRONMENT: Lives with: lives with their family and grandchildren Lives in: House/apartment Stairs: No Has following equipment at home: None  OCCUPATION: Working, going to retire next February. Pt is a custodian and cleans all  day long.   PLOF: Independent  PATIENT GOALS: be able to stand more comfortably so she can fold laundry on her bed (waist high bed)  NEXT MD VISIT: none yet.   OBJECTIVE:  Note: Objective measures were completed at Evaluation unless otherwise noted.  DIAGNOSTIC FINDINGS:    PATIENT SURVEYS:  Modified Oswestry:  MODIFIED OSWESTRY DISABILITY SCALE  Date: 01/28/2024 Score  Pain intensity 1 = The pain is bad, but I can manage without having to take (1) I can stand as long as I want but, it increases my pain. pain medication.  2. Personal care (washing, dressing, etc.) 0 =  I can take care of myself normally without causing increased pain.  3. Lifting 4 = I can lift only very light weights  4. Walking 0 = Pain does not prevent me from walking any distance  5. Sitting 0 =  I can sit in any chair as long as I like.  6. Standing 2 =  Pain prevents me from standing more than 1 hour  7. Sleeping 1 = I can sleep well only by using pain medication.  8. Social Life 2 = Pain prevents me from participating in more energetic activities (eg. sports, dancing).  9. Traveling 0 =  I can travel anywhere without increased pain.  10. Employment/ Homemaking 3 = Pain prevents me from doing anything but light duties.  Total 13/50 (26%)    Interpretation of scores: Score Category Description  0-20% Minimal Disability The patient can cope with most living activities. Usually no treatment is indicated apart from advice on lifting, sitting and exercise  21-40% Moderate Disability The patient experiences more pain and difficulty with sitting, lifting and standing. Travel and social life are more difficult and they may be disabled from work. Personal care, sexual activity and sleeping are not grossly affected, and the patient can usually be managed by conservative means  41-60% Severe Disability Pain remains the main problem in this group, but activities of daily living are affected. These patients require a detailed investigation  61-80% Crippled Back pain impinges on all aspects of the patients life. Positive intervention is required  81-100% Bed-bound  These patients are either bed-bound or exaggerating their symptoms  Bluford FORBES Zoe DELENA Karon DELENA, et al. Surgery versus conservative management of stable thoracolumbar fracture: the PRESTO feasibility RCT. [place] ([place]): [employer]; 2021 Nov. [facility] Technology Assessment, No. 25.62.) Appendix 3, Oswestry Disability Index category descriptors. Available from: Findjewelers.cz  Minimally Clinically Important Difference (MCID) = 12.8%  COGNITION: Overall cognitive status: Within functional limits for tasks assessed     SENSATION: WFL  MUSCLE LENGTH:   POSTURE: L cervical side bend, B protracted shoulders, R shoulder higher, R lateral shift, Movement preference around L3/4 area, L trunk rotation, L lumbar convexity. R knee higher, slight R foot pronation  Decreased pain with manual L to R pressure to decrease L lumbar convexity  Increased lumbar lordosis around L3/4 area  PALPATION: TTP to low back area around L3/4 TTP R > L L5, L4, L3 TP Decreased R UPA to L5, L4, L3 TP Muscle tension R lumbar paraspinal muscles Decreased CPA  to mid thoracic spine.     LUMBAR ROM:   AROM eval  Flexion WFL, decreased back pain, hinging at L3/4 area  Extension [facility] with pain and hinging at L3/4 area  Right lateral flexion WFL with low back pain  Left lateral flexion WFL with low back pain  Right rotation WFL with low back  pain  Left rotation WFL with low back pain.    (Blank rows = not tested)  LOWER EXTREMITY ROM:     Passive  Right eval Left eval  Hip flexion    Hip extension -7  -5  Hip abduction    Hip adduction    Hip internal rotation 32 24  Hip external rotation    Knee flexion    Knee extension    Ankle dorsiflexion    Ankle plantarflexion    Ankle inversion    Ankle eversion     (Blank rows = not tested)  LOWER EXTREMITY MMT:    MMT Right eval Left eval  Hip flexion 4 4  Hip extension 4- 3+  Hip abduction 4- 3-  Hip adduction    Hip internal rotation    Hip external rotation    Knee flexion 5 4  Knee extension 5 5  Ankle dorsiflexion    Ankle plantarflexion    Ankle inversion    Ankle eversion     (Blank rows = not tested)  LUMBAR SPECIAL TESTS:  (-) repeated flexion test with decreased back pain reported     FUNCTIONAL TESTS:    GAIT: Distance walked: 30 ft Assistive device utilized: None Level of assistance: Complete Independence Comments: increased L trunk rotation during R LE swing phase Decreased stance R LE during stance phase Increased lumbar rotation around L3/4  Stairs with B UE assist   Genu valgus, with contralateral pelvic drop and ipsilateral lumbar side bend compensation due to glute and trunk weakness.   TREATMENT DATE: 04/19/2024                                                                                                                                Subjective: ***. No questions or concerns.   Therapeutic exercise   Supine 90/90 with alternating LE extension   3 x 10  Hooklying OH reach with TA Activation   3 x 10 - Weighted Dowel   Sidelying  Clamshell against resistance  L: 3 x 10 - Blue TB   Sidelying Hip Abduction   R/L: 3 x 10 - Light PT resistance at the knee on last set  Neutral Curl Up - R Leg Straight   3 sets x 10s  Supine Thomas Test for Hip Flexor Stretch  30s/bout x 2 in order to improve ROM and tissue extensibility  - Notable tightness with iliopsoas muscle  Therapeutic Activity: NuStep L4-1 x 6 min x UE/LE (Seat 10) for trunk mobility and LE strength for walking ; PT manually adjusted resistance throughout per patient tolerance.   Kettle Bell Squat 1 x 10 reps - 20# KB 1 x 10 reps - 10# KB   Pt with narrow BoS, excessive forward trunk lean and bilateral hip IR/ADD.   - Improved with VC for proper squatting mechanics.    PATIENT EDUCATION:  Education details: Exercise Technique  Person educated: Patient  Education method: Explanation, Demonstration, Tactile cues, Verbal cues, and Handouts Education comprehension: verbalized understanding and returned demonstration  HOME EXERCISE PROGRAM: Access Code: [email or web address] URL: [email or web address] Date: 03/31/2024 Prepared by: Lonni Pall  Exercises - Supine Lower Trunk Rotation  - 2-3 x daily - 5-7 x weekly - 1-2 sets - 10 reps - 3-5 second  hold - Sidelying Thoracic Rotation with Open Book  - 2-3 x daily - 5-7 x weekly - 1-2 sets - 10 reps - 3-5 second  hold - Seated Piriformis Stretch  - 2-3 x daily - 5-7 x weekly - 3-5 reps - 30-60 seconds hold - Supine March with Resistance Band  - 1-2 x daily - 5-7 x weekly - 3 sets - 10 reps - Hooklying Clamshell with Resistance  - 1-2 x daily - 5-7 x weekly - 3 sets - 10 reps - Seated March with Resistance  - 1-2 x daily - 5-7 x weekly - 3 sets - 10 reps - Mini Squat with Counter Support  - 1 x daily - 5-7 x weekly - 2-3 sets - 10 reps - Hip Flexor Stretch at Edge of Bed  - 1 x daily - 5-7 x weekly - 2-3 sets - 30s hold   ASSESSMENT:  CLINICAL IMPRESSION:   Continued PT POC in management of core  strengthening and lumbar mobility. ***.  Session focused on improving core strengthening and lumbar mobility. Manual techniques used in order to decrease muscle tension in the L lumbar paraspinals. Improved pain in the lower back following PT interventions. Squatting form required multimodal cues for proper technique and reduction of lumbar shearing. (See goals below) - patient's pain has shown progress with adherence to HEP and PT interventions. Additionally she has shown improvements in gluteal strength. She still has limitations in glute extension strength and ROM; additional stretches (thomas test position) provided in order to reduce muscle tension in the hip flexor. Per self report on mODI in today's session; patient's pain has worsened her ability to perform functional tasks. (See above in Objective). Patient's condition has the potential to improve in response to therapy. Maximum improvement is yet to be obtained. The anticipated improvement is attainable and reasonable in a generally predictable time. PT requesting for 1x/week for an additional 8 weeks in order to address current deficits and maximize return to PLOF.    OBJECTIVE IMPAIRMENTS: decreased ROM, decreased strength, improper body mechanics, postural dysfunction, and pain.   ACTIVITY LIMITATIONS: lifting, bending, standing, stairs, reach over head, and locomotion level  PARTICIPATION LIMITATIONS:   PERSONAL FACTORS: Fitness, Profession, Time since onset of injury/illness/exacerbation, and 3+ comorbidities: CHF, HTN, ORIF R ankle are also affecting patient's functional outcome.   REHAB POTENTIAL: Fair    CLINICAL DECISION MAKING: Evolving/moderate complexity; Pain seems to be worsening based on subjective reports.   EVALUATION COMPLEXITY: Moderate   GOALS: Goals reviewed with patient? Yes  SHORT TERM GOALS: Target date: 02/06/2024  Pt will be independent with her initial HEP to decrease back pain, improve strength,  function, and ability to tolerated standing and perform standing tasks more comfortably.  Baseline: Pt has started her initial HEP (01/28/2024); 03/31/2024: Patient endorses 75% adherence Goal status: Progressing    LONG TERM GOALS: Target date: 05/26/2024   Pt will have a decrease in low back pain to 3/10 or less at worst to promote ability to perform standing tasks, pick up items from the ground, as well as negotiate stairs more comfortably.  Baseline: Low back pain: 10/10 at most for  the past 3 months. (01/28/2024); 03/02/2024: 7/10 NPS  Goal status: Progressing  2.  Pt will improve B glute max and glute med strength by at least 1/2 MMT grade to promote ability to perform standing tasks, ambulate, negotiate with less back pain.  Baseline:  MMT Right eval Left eval R/L 03/31/24  Hip extension 4- 3+   Hip abduction 4- 3- 4+/4+   Goal status: INITIAL  3.  Pt will improve B hip extension PROM by at least 10 degrees to promote ability to perform standing tasks such as chores more comfortably for her back.  Baseline:  Passive  Right eval Left eval  Hip extension -7  -5   Goal status: INITIAL  4.  Pt will improve her Modified Oswestry Low Back Pain Disability Questionnaire score by at least 10% as a demonstration of improved function.  Baseline: 13/50 (26%) (01/28/2024); 03/31/2024: 16/50 Goal status: Progressing   PLAN:  PT FREQUENCY: 1-2x/week  PT DURATION: 8 weeks  PLANNED INTERVENTIONS: 97110-Therapeutic exercises, 97530- Therapeutic activity, 97112- Neuromuscular re-education, 97535- Self Care, [id number]- Manual therapy, [phone]- Aquatic Therapy, [phone]- Electrical stimulation (unattended), [phone]- Traction (mechanical), [id number]- Ionotophoresis 4mg /ml Dexamethasone , 79439 (1-2 muscles), [id number] (3+ muscles)- Dry Needling, Patient/Family education, Joint mobilization, Spinal manipulation, and Spinal mobilization.  PLAN FOR NEXT SESSION: thoracic and hip extension, thoracic and lumbar  mobility, scapular, trunk, and glute med and max strengthening, lumbopelvic and femoral control, manual techniques, modalities PRN   [name], [name] Physical Therapist- [facility] 04/19/2024, 1:29 PM  "

## 2024-04-28 ENCOUNTER — Ambulatory Visit

## 2024-04-28 DIAGNOSIS — M5459 Other low back pain: Secondary | ICD-10-CM

## 2024-04-28 DIAGNOSIS — M25652 Stiffness of left hip, not elsewhere classified: Secondary | ICD-10-CM

## 2024-04-28 DIAGNOSIS — M25651 Stiffness of right hip, not elsewhere classified: Secondary | ICD-10-CM

## 2024-04-28 NOTE — Therapy (Signed)
 " OUTPATIENT PHYSICAL THERAPY THORACOLUMBAR TREATMENT   Patient Name: Sara Reed MRN: [id number] DOB:1961-01-22, 63 y.o., female Today's Date: 04/28/2024  END OF SESSION:  PT End of Session - 04/28/24 1259     Visit Number 5    Number of Visits 25    Date for Recertification  05/26/24    Authorization Type HB auth#[id number] for 5 PT vsts from 12/17-2/14/26    Authorization Time Period 12/17-02/14/202    Authorization - Number of Visits 5    PT Start Time 1300    PT Stop Time 1340    PT Time Calculation (min) 40 min    Activity Tolerance Patient tolerated treatment well    Behavior During Therapy WFL for tasks assessed/performed              Past Medical History:  Diagnosis Date   Anemia    CHF (congestive heart failure) (HCC)    Hypertension    Past Surgical History:  Procedure Laterality Date   ORIF ANKLE FRACTURE Right 06/04/2019   Procedure: OPEN REDUCTION INTERNAL FIXATION WITH DISTAL FIBULA PLATE AND SYNDESMOTIC REPAIR OF RIGHT ANKLE.;  Surgeon: Mardee Lynwood SQUIBB, MD;  Location: [facility] ORS;  Service: Orthopedics;  Laterality: Right;   Patient Active Problem List   Diagnosis Date Noted   Acute CHF (HCC) 02/13/2022   Hypertension    Myocardial injury    Hypertensive urgency    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 05/03/2020   Acute respiratory failure with hypoxemia (HCC) 06/22/2017    PCP: Tobie Domino, MD   REFERRING PROVIDER: Domino Tobie, MD  REFERRING DIAG: Diagnosis M54.50 (ICD-10-CM) - Low back pain  Rationale for Evaluation and Treatment: Rehabilitation  THERAPY DIAG:  Other low back pain  Stiffness of right hip, not elsewhere classified  Stiffness of left hip, not elsewhere classified  ONSET DATE: 12/23/2023 (date PT referral signed)  SUBJECTIVE:                                                                                                                                                                                            SUBJECTIVE STATEMENT: Low back pain: 10/10 at most for the past 3 months.   PERTINENT HISTORY:  Low back pain. Pain started a while ago (some months ago), gradual onset. Has been using a heating pad and lidocaine  patches. Was told she had tight muscles. Has not yet had imaging for her back.    No latex allergies.   PAIN:  Are you having pain? Yes: NPRS scale: 10/10 currently (pt sitting) Pain location: low back  Pain description: sore, tight, stiff.  Aggravating factors: standing for a long period of time about 45 min or less, standing to do chores. Picking up something from the ground. Going up and down steps bothers her back a little.  Possibly lifting.   Relieving factors: heating pad, lidocaine  patch, deep tissue massage, bending over/leaning over  PRECAUTIONS: No known precautions.  RED FLAGS: Bowel or bladder incontinence: No and Cauda equina syndrome: No   WEIGHT BEARING RESTRICTIONS: No  FALLS:  Has patient fallen in last 6 months? No  LIVING ENVIRONMENT: Lives with: lives with their family and grandchildren Lives in: House/apartment Stairs: No Has following equipment at home: None  OCCUPATION: Working, going to retire next February. Pt is a custodian and cleans all day long.   PLOF: Independent  PATIENT GOALS: be able to stand more comfortably so she can fold laundry on her bed (waist high bed)  NEXT MD VISIT: none yet.   OBJECTIVE:  Note: Objective measures were completed at Evaluation unless otherwise noted.  DIAGNOSTIC FINDINGS:    PATIENT SURVEYS:  Modified Oswestry:  MODIFIED OSWESTRY DISABILITY SCALE  Date: 01/28/2024 Score  Pain intensity 1 = The pain is bad, but I can manage without having to take (1) I can stand as long as I want but, it increases my pain. pain medication.  2. Personal care (washing, dressing, etc.) 0 =  I can take care of myself normally without causing increased pain.  3. Lifting 4 = I can lift  only very light weights  4. Walking 0 = Pain does not prevent me from walking any distance  5. Sitting 0 =  I can sit in any chair as long as I like.  6. Standing 2 =  Pain prevents me from standing more than 1 hour  7. Sleeping 1 = I can sleep well only by using pain medication.  8. Social Life 2 = Pain prevents me from participating in more energetic activities (eg. sports, dancing).  9. Traveling 0 =  I can travel anywhere without increased pain.  10. Employment/ Homemaking 3 = Pain prevents me from doing anything but light duties.  Total 13/50 (26%)   Interpretation of scores: Score Category Description  0-20% Minimal Disability The patient can cope with most living activities. Usually no treatment is indicated apart from advice on lifting, sitting and exercise  21-40% Moderate Disability The patient experiences more pain and difficulty with sitting, lifting and standing. Travel and social life are more difficult and they may be disabled from work. Personal care, sexual activity and sleeping are not grossly affected, and the patient can usually be managed by conservative means  41-60% Severe Disability Pain remains the main problem in this group, but activities of daily living are affected. These patients require a detailed investigation  61-80% Crippled Back pain impinges on all aspects of the patients life. Positive intervention is required  81-100% Bed-bound  These patients are either bed-bound or exaggerating their symptoms  Bluford FORBES Zoe DELENA Karon DELENA, et al. Surgery versus conservative management of stable thoracolumbar fracture: the PRESTO feasibility RCT. [place] ([place]): [employer]; 2021 Nov. [facility] Technology Assessment, No. 25.62.) Appendix 3, Oswestry Disability Index category descriptors. Available from: Findjewelers.cz  Minimally Clinically Important Difference (MCID) = 12.8%  COGNITION: Overall cognitive status: Within functional  limits for tasks assessed     SENSATION: WFL  MUSCLE LENGTH:   POSTURE: L cervical side bend, B protracted shoulders, R shoulder higher, R lateral shift, Movement preference around L3/4 area, L trunk rotation,  L lumbar convexity. R knee higher, slight R foot pronation  Decreased pain with manual L to R pressure to decrease L lumbar convexity  Increased lumbar lordosis around L3/4 area  PALPATION: TTP to low back area around L3/4 TTP R > L L5, L4, L3 TP Decreased R UPA to L5, L4, L3 TP Muscle tension R lumbar paraspinal muscles Decreased CPA to mid thoracic spine.     LUMBAR ROM:   AROM eval  Flexion WFL, decreased back pain, hinging at L3/4 area  Extension [facility] with pain and hinging at L3/4 area  Right lateral flexion WFL with low back pain  Left lateral flexion WFL with low back pain  Right rotation [facility] with low back pain  Left rotation WFL with low back pain.    (Blank rows = not tested)  LOWER EXTREMITY ROM:     Passive  Right eval Left eval  Hip flexion    Hip extension -7  -5  Hip abduction    Hip adduction    Hip internal rotation 32 24  Hip external rotation    Knee flexion    Knee extension    Ankle dorsiflexion    Ankle plantarflexion    Ankle inversion    Ankle eversion     (Blank rows = not tested)  LOWER EXTREMITY MMT:    MMT Right eval Left eval  Hip flexion 4 4  Hip extension 4- 3+  Hip abduction 4- 3-  Hip adduction    Hip internal rotation    Hip external rotation    Knee flexion 5 4  Knee extension 5 5  Ankle dorsiflexion    Ankle plantarflexion    Ankle inversion    Ankle eversion     (Blank rows = not tested)  LUMBAR SPECIAL TESTS:  (-) repeated flexion test with decreased back pain reported     FUNCTIONAL TESTS:    GAIT: Distance walked: 30 ft Assistive device utilized: None Level of assistance: Complete Independence Comments: increased L trunk rotation during R LE swing phase Decreased stance R LE during stance  phase Increased lumbar rotation around L3/4  Stairs with B UE assist   Genu valgus, with contralateral pelvic drop and ipsilateral lumbar side bend compensation due to glute and trunk weakness.   TREATMENT DATE: 04/28/2024                                                                                                                                Subjective: Patient reports 6-7/10 pain across the lower back. Patient continues to perform HEP in order to mitigate lower back pain. No questions or concerns.   Therapeutic exercise   Lumbar Rollout with White Physioball   Forward x 10  R/L x 5 ea side  Sidelying Clamshell against resistance  L: 3 x 10 - Green TB  Sidelying Hip Abduction - Leg Straight   L: 2 x 10   Hooklying Bridge  3 x 10 - 5# AW placed on torso for proper TA brace   Supine 90/90 with OH reach using weighted dowel   3 x 10    Supine 90/90 Physio Ball Squeeze with UE/LE  3 x 15s   Dead Bug for core stability and LE strength   3 x 10   Therapeutic Activity (involving multiple parameters to increase core stability for functional activities):  NuStep L4-1 x 6 min x UE/LE (Seat 10) for trunk mobility and LE strength for walking ; PT manually adjusted resistance throughout per patient tolerance.   Supine 90/90 with LE Extension   3 x 10 - Alternating LE   Kettle Bell Squat  1 x 10 - less VC needed for proper hip hinge technique  PATIENT EDUCATION:  Education details: Exercise Technique  Person educated: Patient Education method: Explanation, Demonstration, Tactile cues, Verbal cues, and Handouts Education comprehension: verbalized understanding and returned demonstration  HOME EXERCISE PROGRAM: Access Code: [email or web address] URL: [email or web address] Date: 03/31/2024 Prepared by: Lonni Pall  Exercises - Supine Lower Trunk Rotation  - 2-3 x daily - 5-7 x weekly - 1-2 sets - 10 reps - 3-5 second  hold - Sidelying Thoracic Rotation with Open  Book  - 2-3 x daily - 5-7 x weekly - 1-2 sets - 10 reps - 3-5 second  hold - Seated Piriformis Stretch  - 2-3 x daily - 5-7 x weekly - 3-5 reps - 30-60 seconds hold - Supine March with Resistance Band  - 1-2 x daily - 5-7 x weekly - 3 sets - 10 reps - Hooklying Clamshell with Resistance  - 1-2 x daily - 5-7 x weekly - 3 sets - 10 reps - Seated March with Resistance  - 1-2 x daily - 5-7 x weekly - 3 sets - 10 reps - Mini Squat with Counter Support  - 1 x daily - 5-7 x weekly - 2-3 sets - 10 reps - Hip Flexor Stretch at Edge of Bed  - 1 x daily - 5-7 x weekly - 2-3 sets - 30s hold   ASSESSMENT:  CLINICAL IMPRESSION:   Continued PT POC in management of core strengthening and lumbar mobility. Patient demonstrated proper squatting technique with KB placed on stool with minimal cues from PT. Session with heavy focus on core stability and gluteal strengthening in order to improve work related tasks. She demonstrated good ability to maintain neutral spine while performing limb movements with TA activation. Mod VC for proper sequencing for LE extension from supine 90/90 position.Patient will continue to benefit from skilled therapy to address remaining deficits in order to improve quality of life and return to PLOF.    OBJECTIVE IMPAIRMENTS: decreased ROM, decreased strength, improper body mechanics, postural dysfunction, and pain.   ACTIVITY LIMITATIONS: lifting, bending, standing, stairs, reach over head, and locomotion level  PARTICIPATION LIMITATIONS:   PERSONAL FACTORS: Fitness, Profession, Time since onset of injury/illness/exacerbation, and 3+ comorbidities: CHF, HTN, ORIF R ankle are also affecting patient's functional outcome.   REHAB POTENTIAL: Fair    CLINICAL DECISION MAKING: Evolving/moderate complexity; Pain seems to be worsening based on subjective reports.   EVALUATION COMPLEXITY: Moderate   GOALS: Goals reviewed with patient? Yes  SHORT TERM GOALS: Target date:  02/06/2024  Pt will be independent with her initial HEP to decrease back pain, improve strength, function, and ability to tolerated standing and perform standing tasks more comfortably.  Baseline: Pt has started her initial HEP (01/28/2024); 03/31/2024: Patient endorses 75% adherence Goal status: Progressing  LONG TERM GOALS: Target date: 05/26/2024   Pt will have a decrease in low back pain to 3/10 or less at worst to promote ability to perform standing tasks, pick up items from the ground, as well as negotiate stairs more comfortably.  Baseline: Low back pain: 10/10 at most for the past 3 months. (01/28/2024); 03/02/2024: 7/10 NPS  Goal status: Progressing  2.  Pt will improve B glute max and glute med strength by at least 1/2 MMT grade to promote ability to perform standing tasks, ambulate, negotiate with less back pain.  Baseline:  MMT Right eval Left eval R/L 03/31/24  Hip extension 4- 3+   Hip abduction 4- 3- 4+/4+   Goal status: INITIAL  3.  Pt will improve B hip extension PROM by at least 10 degrees to promote ability to perform standing tasks such as chores more comfortably for her back.  Baseline:  Passive  Right eval Left eval  Hip extension -7  -5   Goal status: INITIAL  4.  Pt will improve her Modified Oswestry Low Back Pain Disability Questionnaire score by at least 10% as a demonstration of improved function.  Baseline: 13/50 (26%) (01/28/2024); 03/31/2024: 16/50 Goal status: Progressing   PLAN:  PT FREQUENCY: 1-2x/week  PT DURATION: 8 weeks  PLANNED INTERVENTIONS: 97110-Therapeutic exercises, 97530- Therapeutic activity, 97112- Neuromuscular re-education, 97535- Self Care, 02859- Manual therapy, [phone]- Aquatic Therapy, [phone]- Electrical stimulation (unattended), [phone]- Traction (mechanical), [id number]- Ionotophoresis 4mg /ml Dexamethasone , 79439 (1-2 muscles), [id number] (3+ muscles)- Dry Needling, Patient/Family education, Joint mobilization, Spinal manipulation, and  Spinal mobilization.  PLAN FOR NEXT SESSION: thoracic and hip extension, thoracic and lumbar mobility, scapular, trunk, and glute med and max strengthening, lumbopelvic and femoral control, manual techniques, modalities PRN   [name], [name] Physical Therapist- [facility] 04/28/2024, 1:03 PM  "

## 2024-05-04 ENCOUNTER — Ambulatory Visit

## 2024-05-11 ENCOUNTER — Ambulatory Visit

## 2024-05-11 ENCOUNTER — Telehealth: Payer: Self-pay

## 2024-05-11 ENCOUNTER — Ambulatory Visit: Attending: Family Medicine

## 2024-05-11 DIAGNOSIS — M5459 Other low back pain: Secondary | ICD-10-CM | POA: Insufficient documentation

## 2024-05-11 DIAGNOSIS — M25651 Stiffness of right hip, not elsewhere classified: Secondary | ICD-10-CM | POA: Insufficient documentation

## 2024-05-11 DIAGNOSIS — M25652 Stiffness of left hip, not elsewhere classified: Secondary | ICD-10-CM | POA: Insufficient documentation

## 2024-05-11 NOTE — Telephone Encounter (Signed)
 Called patient regarding no show. Patient reports that she was never informed about today's appointments after speaking with front end staff in the last session. She reported that she will be present for following appt. No further questions at the end.   [name], [name] Physical Therapist- [place]

## 2024-05-13 ENCOUNTER — Ambulatory Visit

## 2024-05-13 DIAGNOSIS — M5459 Other low back pain: Secondary | ICD-10-CM

## 2024-05-13 DIAGNOSIS — M25652 Stiffness of left hip, not elsewhere classified: Secondary | ICD-10-CM

## 2024-05-13 DIAGNOSIS — M25651 Stiffness of right hip, not elsewhere classified: Secondary | ICD-10-CM

## 2024-05-13 NOTE — Therapy (Signed)
 " OUTPATIENT PHYSICAL THERAPY THORACOLUMBAR TREATMENT   Patient Name: Sara Reed MRN: [id number] DOB:June 12, 1960, 64 y.o., female Today's Date: 05/13/2024  END OF SESSION:  PT End of Session - 05/13/24 1701     Visit Number 6    Number of Visits 25    Date for Recertification  05/26/24    Authorization Type HB auth#[id number] for 5 PT vsts from 12/17-2/14/26    Authorization Time Period 12/17-02/14/202    Authorization - Visit Number 2    Authorization - Number of Visits 5    PT Start Time 1705    PT Stop Time 1730    PT Time Calculation (min) 25 min    Activity Tolerance Patient tolerated treatment well    Behavior During Therapy WFL for tasks assessed/performed              Past Medical History:  Diagnosis Date   Anemia    CHF (congestive heart failure) (HCC)    Hypertension    Past Surgical History:  Procedure Laterality Date   ORIF ANKLE FRACTURE Right 06/04/2019   Procedure: OPEN REDUCTION INTERNAL FIXATION WITH DISTAL FIBULA PLATE AND SYNDESMOTIC REPAIR OF RIGHT ANKLE.;  Surgeon: Hooten, James P, MD;  Location: [facility] ORS;  Service: Orthopedics;  Laterality: Right;   Patient Active Problem List   Diagnosis Date Noted   Acute CHF (HCC) 02/13/2022   Hypertension    Myocardial injury    Hypertensive urgency    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 05/03/2020   Acute respiratory failure with hypoxemia (HCC) 06/22/2017    PCP: Tobie Domino, MD   REFERRING PROVIDER: Domino Tobie, MD  REFERRING DIAG: Diagnosis M54.50 (ICD-10-CM) - Low back pain  Rationale for Evaluation and Treatment: Rehabilitation  THERAPY DIAG:  Other low back pain  Stiffness of right hip, not elsewhere classified  Stiffness of left hip, not elsewhere classified  ONSET DATE: 12/23/2023 (date PT referral signed)  SUBJECTIVE:                                                                                                                                                                                            SUBJECTIVE STATEMENT: Low back pain: 10/10 at most for the past 3 months.   PERTINENT HISTORY:  Low back pain. Pain started a while ago (some months ago), gradual onset. Has been using a heating pad and lidocaine  patches. Was told she had tight muscles. Has not yet had imaging for her back.    No latex allergies.   PAIN:  Are you having pain? Yes: NPRS scale: 10/10 currently (pt sitting) Pain location:  low back  Pain description: sore, tight, stiff.  Aggravating factors: standing for a long period of time about 45 min or less, standing to do chores. Picking up something from the ground. Going up and down steps bothers her back a little.  Possibly lifting.   Relieving factors: heating pad, lidocaine  patch, deep tissue massage, bending over/leaning over  PRECAUTIONS: No known precautions.  RED FLAGS: Bowel or bladder incontinence: No and Cauda equina syndrome: No   WEIGHT BEARING RESTRICTIONS: No  FALLS:  Has patient fallen in last 6 months? No  LIVING ENVIRONMENT: Lives with: lives with their family and grandchildren Lives in: House/apartment Stairs: No Has following equipment at home: None  OCCUPATION: Working, going to retire next February. Pt is a custodian and cleans all day long.   PLOF: Independent  PATIENT GOALS: be able to stand more comfortably so she can fold laundry on her bed (waist high bed)  NEXT MD VISIT: none yet.   OBJECTIVE:  Note: Objective measures were completed at Evaluation unless otherwise noted.  DIAGNOSTIC FINDINGS:    PATIENT SURVEYS:  Modified Oswestry:  MODIFIED OSWESTRY DISABILITY SCALE  Date: 01/28/2024 Score  Pain intensity 1 = The pain is bad, but I can manage without having to take (1) I can stand as long as I want but, it increases my pain. pain medication.  2. Personal care (washing, dressing, etc.) 0 =  I can take care of myself normally without causing increased  pain.  3. Lifting 4 = I can lift only very light weights  4. Walking 0 = Pain does not prevent me from walking any distance  5. Sitting 0 =  I can sit in any chair as long as I like.  6. Standing 2 =  Pain prevents me from standing more than 1 hour  7. Sleeping 1 = I can sleep well only by using pain medication.  8. Social Life 2 = Pain prevents me from participating in more energetic activities (eg. sports, dancing).  9. Traveling 0 =  I can travel anywhere without increased pain.  10. Employment/ Homemaking 3 = Pain prevents me from doing anything but light duties.  Total 13/50 (26%)   Interpretation of scores: Score Category Description  0-20% Minimal Disability The patient can cope with most living activities. Usually no treatment is indicated apart from advice on lifting, sitting and exercise  21-40% Moderate Disability The patient experiences more pain and difficulty with sitting, lifting and standing. Travel and social life are more difficult and they may be disabled from work. Personal care, sexual activity and sleeping are not grossly affected, and the patient can usually be managed by conservative means  41-60% Severe Disability Pain remains the main problem in this group, but activities of daily living are affected. These patients require a detailed investigation  61-80% Crippled Back pain impinges on all aspects of the patients life. Positive intervention is required  81-100% Bed-bound  These patients are either bed-bound or exaggerating their symptoms  Bluford FORBES Zoe DELENA Karon DELENA, et al. Surgery versus conservative management of stable thoracolumbar fracture: the PRESTO feasibility RCT. [place] ([place]): [employer]; 2021 Nov. [facility] Technology Assessment, No. 25.62.) Appendix 3, Oswestry Disability Index category descriptors. Available from: Findjewelers.cz  Minimally Clinically Important Difference (MCID) = 12.8%  COGNITION: Overall  cognitive status: Within functional limits for tasks assessed     SENSATION: WFL  MUSCLE LENGTH:   POSTURE: L cervical side bend, B protracted shoulders, R shoulder higher, R lateral  shift, Movement preference around L3/4 area, L trunk rotation, L lumbar convexity. R knee higher, slight R foot pronation  Decreased pain with manual L to R pressure to decrease L lumbar convexity  Increased lumbar lordosis around L3/4 area  PALPATION: TTP to low back area around L3/4 TTP R > L L5, L4, L3 TP Decreased R UPA to L5, L4, L3 TP Muscle tension R lumbar paraspinal muscles Decreased CPA to mid thoracic spine.     LUMBAR ROM:   AROM eval  Flexion WFL, decreased back pain, hinging at L3/4 area  Extension [facility] with pain and hinging at L3/4 area  Right lateral flexion WFL with low back pain  Left lateral flexion WFL with low back pain  Right rotation [facility] with low back pain  Left rotation WFL with low back pain.    (Blank rows = not tested)  LOWER EXTREMITY ROM:     Passive  Right eval Left eval  Hip flexion    Hip extension -7  -5  Hip abduction    Hip adduction    Hip internal rotation 32 24  Hip external rotation    Knee flexion    Knee extension    Ankle dorsiflexion    Ankle plantarflexion    Ankle inversion    Ankle eversion     (Blank rows = not tested)  LOWER EXTREMITY MMT:    MMT Right eval Left eval  Hip flexion 4 4  Hip extension 4- 3+  Hip abduction 4- 3-  Hip adduction    Hip internal rotation    Hip external rotation    Knee flexion 5 4  Knee extension 5 5  Ankle dorsiflexion    Ankle plantarflexion    Ankle inversion    Ankle eversion     (Blank rows = not tested)  LUMBAR SPECIAL TESTS:  (-) repeated flexion test with decreased back pain reported     FUNCTIONAL TESTS:    GAIT: Distance walked: 30 ft Assistive device utilized: None Level of assistance: Complete Independence Comments: increased L trunk rotation during R LE swing  phase Decreased stance R LE during stance phase Increased lumbar rotation around L3/4  Stairs with B UE assist   Genu valgus, with contralateral pelvic drop and ipsilateral lumbar side bend compensation due to glute and trunk weakness.   TREATMENT DATE: 05/13/24                                                                                                                                Subjective: Patient reports 7/10 pain across the lower back. 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 No questions or concerns.   Therapeutic exercise  NuStep L4-1 x 5 min x UE/LE (Seat 10) for trunk mobility and LE strength; PT manually adjusted resistance throughout per patient tolerance.   Hooklying [place] Medball Reach  3 x 10 - 3 Kg MB   Supine 90/90 Med ball press with alternating marches   2 x 20 reps - 3 Kg MB  Hooklying Bridge   3 x 10 - 3kg MB on torso for proper TA brace   Modified Seated Torso Rotation   2 x 20 alt direction - 3 Kg Med Ball   Supine Lumbar Rotation   20x alternating direction   Seated Physioball Rollout for lumbar mobility   10x Forward Direction   10x Right/Left Dir   Seated Lumbar Extension   1 x 15 reps - BUE facilitating lumbar ext   PATIENT EDUCATION:  Education details: Exercise Technique  Person educated: Patient Education method: Explanation, Demonstration, Tactile cues, Verbal cues, and Handouts Education comprehension: verbalized understanding and returned demonstration  HOME EXERCISE PROGRAM: Access Code: [email or web address] URL: [email or web address] Date: 03/31/2024 Prepared by: Lonni Pall  Exercises - Supine Lower Trunk Rotation  - 2-3 x daily - 5-7 x weekly - 1-2 sets - 10 reps - 3-5 second  hold - Sidelying Thoracic Rotation with Open Book  - 2-3 x daily - 5-7 x weekly - 1-2 sets - 10 reps - 3-5 second  hold - Seated Piriformis Stretch  - 2-3 x daily - 5-7 x weekly -  3-5 reps - 30-60 seconds hold - Supine March with Resistance Band  - 1-2 x daily - 5-7 x weekly - 3 sets - 10 reps - Hooklying Clamshell with Resistance  - 1-2 x daily - 5-7 x weekly - 3 sets - 10 reps - Seated March with Resistance  - 1-2 x daily - 5-7 x weekly - 3 sets - 10 reps - Mini Squat with Counter Support  - 1 x daily - 5-7 x weekly - 2-3 sets - 10 reps - Hip Flexor Stretch at Edge of Bed  - 1 x daily - 5-7 x weekly - 2-3 sets - 30s hold   ASSESSMENT:  CLINICAL IMPRESSION:   Continued PT POC in management of lower back pain. Session limited due to late arrival. Remainder of session focused on core strengthening and lumbar mobility. Patient tolerated mat level exercises without exacerbation of pain. Repeated extensions with endorsement of improved pain, PT educated patient to perform more frequently during work shift. Patient requiring multimodal cues for neutral pelvic tilt to prevent excessive anterior tilt. Patient still presenting with limited hip extension. She still experiences intermittent pain that limits her full participation with recreational tasks and work related tasks. Patient will continue to benefit from skilled therapy to address remaining deficits in order to improve quality of life and return to PLOF.   OBJECTIVE IMPAIRMENTS: decreased ROM, decreased strength, improper body mechanics, postural dysfunction, and pain.   ACTIVITY LIMITATIONS: lifting, bending, standing, stairs, reach over head, and locomotion level  PARTICIPATION LIMITATIONS:   PERSONAL FACTORS: Fitness, Profession, Time since onset of injury/illness/exacerbation, and 3+ comorbidities: CHF, HTN, ORIF R ankle are also affecting patient's functional outcome.   REHAB POTENTIAL: Fair    CLINICAL DECISION MAKING: Evolving/moderate complexity; Pain seems to be worsening based on subjective reports.   EVALUATION COMPLEXITY: Moderate   GOALS: Goals reviewed with patient? Yes  SHORT TERM GOALS: Target  date: 02/06/2024  Pt will be independent with her initial HEP to decrease back pain, improve strength, function, and ability to tolerated standing and perform standing tasks more comfortably.  Baseline: Pt has started her initial HEP (01/28/2024); 03/31/2024: Patient endorses 75% adherence Goal status: Progressing    LONG TERM GOALS:  Target date: 05/26/2024   Pt will have a decrease in low back pain to 3/10 or less at worst to promote ability to perform standing tasks, pick up items from the ground, as well as negotiate stairs more comfortably.  Baseline: Low back pain: 10/10 at most for the past 3 months. (01/28/2024); 03/02/2024: 7/10 NPS  Goal status: Progressing  2.  Pt will improve B glute max and glute med strength by at least 1/2 MMT grade to promote ability to perform standing tasks, ambulate, negotiate with less back pain.  Baseline:  MMT Right eval Left eval R/L 03/31/24  Hip extension 4- 3+   Hip abduction 4- 3- 4+/4+   Goal status: INITIAL  3.  Pt will improve B hip extension PROM by at least 10 degrees to promote ability to perform standing tasks such as chores more comfortably for her back.  Baseline:  Passive  Right eval Left eval  Hip extension -7  -5   Goal status: INITIAL  4.  Pt will improve her Modified Oswestry Low Back Pain Disability Questionnaire score by at least 10% as a demonstration of improved function.  Baseline: 13/50 (26%) (01/28/2024); 03/31/2024: 16/50 Goal status: Progressing   PLAN:  PT FREQUENCY: 1-2x/week  PT DURATION: 8 weeks  PLANNED INTERVENTIONS: 97110-Therapeutic exercises, 97530- Therapeutic activity, 97112- Neuromuscular re-education, 97535- Self Care, [id number]- Manual therapy, [phone]- Aquatic Therapy, [phone]- Electrical stimulation (unattended), [phone]- Traction (mechanical), [id number]- Ionotophoresis 4mg /ml Dexamethasone , 79439 (1-2 muscles), 20561 (3+ muscles)- Dry Needling, Patient/Family education, Joint mobilization, Spinal  manipulation, and Spinal mobilization.  PLAN FOR NEXT SESSION: thoracic and hip extension, thoracic and lumbar mobility, scapular, trunk, and glute med and max strengthening, lumbopelvic and femoral control, manual techniques, modalities PRN   [name], [name] Physical Therapist- [facility] 05/13/2024, 6:44 PM  "

## 2024-05-18 ENCOUNTER — Ambulatory Visit

## 2024-05-19 ENCOUNTER — Ambulatory Visit

## 2024-05-19 DIAGNOSIS — M25651 Stiffness of right hip, not elsewhere classified: Secondary | ICD-10-CM

## 2024-05-19 DIAGNOSIS — M5459 Other low back pain: Secondary | ICD-10-CM

## 2024-05-19 DIAGNOSIS — M25652 Stiffness of left hip, not elsewhere classified: Secondary | ICD-10-CM

## 2024-05-19 NOTE — Therapy (Signed)
 " OUTPATIENT PHYSICAL THERAPY THORACOLUMBAR TREATMENT   Patient Name: Sara Reed MRN: [id number] DOB:05-15-1960, 64 y.o., female Today's Date: 05/19/2024  END OF SESSION:  PT End of Session - 05/19/24 1734     Visit Number 7    Number of Visits 25    Date for Recertification  05/26/24    Authorization Type HB auth#[id number] for 5 PT vsts from 12/17-2/14/26    Authorization Time Period 12/17-02/14/202    Authorization - Visit Number 3    Authorization - Number of Visits 5    PT Start Time 1733    PT Stop Time 1815    PT Time Calculation (min) 42 min    Activity Tolerance Patient tolerated treatment well    Behavior During Therapy WFL for tasks assessed/performed              Past Medical History:  Diagnosis Date   Anemia    CHF (congestive heart failure) (HCC)    Hypertension    Past Surgical History:  Procedure Laterality Date   ORIF ANKLE FRACTURE Right 06/04/2019   Procedure: OPEN REDUCTION INTERNAL FIXATION WITH DISTAL FIBULA PLATE AND SYNDESMOTIC REPAIR OF RIGHT ANKLE.;  Surgeon: Hooten, James P, MD;  Location: [facility] ORS;  Service: Orthopedics;  Laterality: Right;   Patient Active Problem List   Diagnosis Date Noted   Acute CHF (HCC) 02/13/2022   Hypertension    Myocardial injury    Hypertensive urgency    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 05/03/2020   Acute respiratory failure with hypoxemia (HCC) 06/22/2017    PCP: Tobie Domino, MD   REFERRING PROVIDER: Domino Tobie, MD  REFERRING DIAG: Diagnosis M54.50 (ICD-10-CM) - Low back pain  Rationale for Evaluation and Treatment: Rehabilitation  THERAPY DIAG:  Other low back pain  Stiffness of right hip, not elsewhere classified  Stiffness of left hip, not elsewhere classified  ONSET DATE: 12/23/2023 (date PT referral signed)  SUBJECTIVE:                                                                                                                                                                                            SUBJECTIVE STATEMENT: Low back pain: 10/10 at most for the past 3 months.   PERTINENT HISTORY:  Low back pain. Pain started a while ago (some months ago), gradual onset. Has been using a heating pad and lidocaine  patches. Was told she had tight muscles. Has not yet had imaging for her back.    No latex allergies.   PAIN:  Are you having pain? Yes: NPRS scale: 10/10 currently (pt sitting) Pain location:  low back  Pain description: sore, tight, stiff.  Aggravating factors: standing for a long period of time about 45 min or less, standing to do chores. Picking up something from the ground. Going up and down steps bothers her back a little.  Possibly lifting.   Relieving factors: heating pad, lidocaine  patch, deep tissue massage, bending over/leaning over  PRECAUTIONS: No known precautions.  RED FLAGS: Bowel or bladder incontinence: No and Cauda equina syndrome: No   WEIGHT BEARING RESTRICTIONS: No  FALLS:  Has patient fallen in last 6 months? No  LIVING ENVIRONMENT: Lives with: lives with their family and grandchildren Lives in: House/apartment Stairs: No Has following equipment at home: None  OCCUPATION: Working, going to retire next February. Pt is a custodian and cleans all day long.   PLOF: Independent  PATIENT GOALS: be able to stand more comfortably so she can fold laundry on her bed (waist high bed)  NEXT MD VISIT: none yet.   OBJECTIVE:  Note: Objective measures were completed at Evaluation unless otherwise noted.  DIAGNOSTIC FINDINGS:    PATIENT SURVEYS:  Modified Oswestry:  MODIFIED OSWESTRY DISABILITY SCALE  Date: 01/28/2024 Score  Pain intensity 1 = The pain is bad, but I can manage without having to take (1) I can stand as long as I want but, it increases my pain. pain medication.  2. Personal care (washing, dressing, etc.) 0 =  I can take care of myself normally without causing increased  pain.  3. Lifting 4 = I can lift only very light weights  4. Walking 0 = Pain does not prevent me from walking any distance  5. Sitting 0 =  I can sit in any chair as long as I like.  6. Standing 2 =  Pain prevents me from standing more than 1 hour  7. Sleeping 1 = I can sleep well only by using pain medication.  8. Social Life 2 = Pain prevents me from participating in more energetic activities (eg. sports, dancing).  9. Traveling 0 =  I can travel anywhere without increased pain.  10. Employment/ Homemaking 3 = Pain prevents me from doing anything but light duties.  Total 13/50 (26%)   Interpretation of scores: Score Category Description  0-20% Minimal Disability The patient can cope with most living activities. Usually no treatment is indicated apart from advice on lifting, sitting and exercise  21-40% Moderate Disability The patient experiences more pain and difficulty with sitting, lifting and standing. Travel and social life are more difficult and they may be disabled from work. Personal care, sexual activity and sleeping are not grossly affected, and the patient can usually be managed by conservative means  41-60% Severe Disability Pain remains the main problem in this group, but activities of daily living are affected. These patients require a detailed investigation  61-80% Crippled Back pain impinges on all aspects of the patients life. Positive intervention is required  81-100% Bed-bound  These patients are either bed-bound or exaggerating their symptoms  Bluford FORBES Zoe DELENA Karon DELENA, et al. Surgery versus conservative management of stable thoracolumbar fracture: the PRESTO feasibility RCT. [place] ([place]): [employer]; 2021 Nov. [facility] Technology Assessment, No. 25.62.) Appendix 3, Oswestry Disability Index category descriptors. Available from: Findjewelers.cz  Minimally Clinically Important Difference (MCID) = 12.8%  COGNITION: Overall  cognitive status: Within functional limits for tasks assessed     SENSATION: WFL  MUSCLE LENGTH:   POSTURE: L cervical side bend, B protracted shoulders, R shoulder higher, R lateral  shift, Movement preference around L3/4 area, L trunk rotation, L lumbar convexity. R knee higher, slight R foot pronation  Decreased pain with manual L to R pressure to decrease L lumbar convexity  Increased lumbar lordosis around L3/4 area  PALPATION: TTP to low back area around L3/4 TTP R > L L5, L4, L3 TP Decreased R UPA to L5, L4, L3 TP Muscle tension R lumbar paraspinal muscles Decreased CPA to mid thoracic spine.     LUMBAR ROM:   AROM eval  Flexion WFL, decreased back pain, hinging at L3/4 area  Extension [facility] with pain and hinging at L3/4 area  Right lateral flexion WFL with low back pain  Left lateral flexion WFL with low back pain  Right rotation [facility] with low back pain  Left rotation WFL with low back pain.    (Blank rows = not tested)  LOWER EXTREMITY ROM:     Passive  Right eval Left eval  Hip flexion    Hip extension -7  -5  Hip abduction    Hip adduction    Hip internal rotation 32 24  Hip external rotation    Knee flexion    Knee extension    Ankle dorsiflexion    Ankle plantarflexion    Ankle inversion    Ankle eversion     (Blank rows = not tested)  LOWER EXTREMITY MMT:    MMT Right eval Left eval  Hip flexion 4 4  Hip extension 4- 3+  Hip abduction 4- 3-  Hip adduction    Hip internal rotation    Hip external rotation    Knee flexion 5 4  Knee extension 5 5  Ankle dorsiflexion    Ankle plantarflexion    Ankle inversion    Ankle eversion     (Blank rows = not tested)  LUMBAR SPECIAL TESTS:  (-) repeated flexion test with decreased back pain reported     FUNCTIONAL TESTS:    GAIT: Distance walked: 30 ft Assistive device utilized: None Level of assistance: Complete Independence Comments: increased L trunk rotation during R LE swing  phase Decreased stance R LE during stance phase Increased lumbar rotation around L3/4  Stairs with B UE assist   Genu valgus, with contralateral pelvic drop and ipsilateral lumbar side bend compensation due to glute and trunk weakness.   TREATMENT DATE: 05/19/24                                                                                                                                Subjective: Patient reports 4/10 pain across the lower back. Patient reports that she had to do a lot of repetitive tasks at work. She reports that she enjoys doing the home exercises. No questions or concerns.   Therapeutic exercise (with the intent on improving core stability  NuStep L4-1 x 5 min x UE/LE (Seat 10) SPM > 80 for trunk mobility and LE strength; PT manually  adjusted resistance throughout per patient tolerance.   Hooklying OH Medball Reach   3 x 10 - 3 Kg MB   Supine Bridge   3 x 10 reps  Supine 90/90 Med ball press with alternating marches   2 x 20 reps - 3 Kg MB  Modified Seated Torso Rotation   2 x 20 alt direction - 3 Kg Med Ball   Supine Lumbar Rotation   20x alternating direction   Supine SLR  R/L: 3 x 10 reps ea leg   Neutral Curl   R/L: 3 x 10s hold ea side   Lumbar Flexion with Physioball Rollout   15x reps   Manual Therapy:   Light STM applied to lumbar paraspinal, QL (L > R) musculature for pain modulation and decrease muscle tension. Myofascial release, trigger point and contract and relax techniques utilized. Pt endorsed improvements in pain following intervention.   PATIENT EDUCATION:  Education details: Exercise Technique  Person educated: Patient Education method: Explanation, Demonstration, Tactile cues, Verbal cues, and Handouts Education comprehension: verbalized understanding and returned demonstration  HOME EXERCISE PROGRAM: Access Code: [email or web address] URL: [email or web address] Date: 03/31/2024 Prepared by: Lonni Pall  Exercises -  Supine Lower Trunk Rotation  - 2-3 x daily - 5-7 x weekly - 1-2 sets - 10 reps - 3-5 second  hold - Sidelying Thoracic Rotation with Open Book  - 2-3 x daily - 5-7 x weekly - 1-2 sets - 10 reps - 3-5 second  hold - Seated Piriformis Stretch  - 2-3 x daily - 5-7 x weekly - 3-5 reps - 30-60 seconds hold - Supine March with Resistance Band  - 1-2 x daily - 5-7 x weekly - 3 sets - 10 reps - Hooklying Clamshell with Resistance  - 1-2 x daily - 5-7 x weekly - 3 sets - 10 reps - Seated March with Resistance  - 1-2 x daily - 5-7 x weekly - 3 sets - 10 reps - Mini Squat with Counter Support  - 1 x daily - 5-7 x weekly - 2-3 sets - 10 reps - Hip Flexor Stretch at Edge of Bed  - 1 x daily - 5-7 x weekly - 2-3 sets - 30s hold   ASSESSMENT:  CLINICAL IMPRESSION:   Continued PT POC in management of lower back pain. Session focused on core strengthening for increased lumbar stability and manual techniques to decrease muscle tension in the L lumbar paraspinals. Good ability to maintain TA activation while performing limb excursions with minimal cues for neutral pelvic tilt. Improved hip extension with supine bridges, patient able to clear hips from the mat table in today's session. She still experiences intermittent pain that limits her full participation with recreational tasks and work related tasks.Patient will continue to benefit from skilled therapy to address remaining deficits in order to improve quality of life and return to PLOF.   OBJECTIVE IMPAIRMENTS: decreased ROM, decreased strength, improper body mechanics, postural dysfunction, and pain.   ACTIVITY LIMITATIONS: lifting, bending, standing, stairs, reach over head, and locomotion level  PARTICIPATION LIMITATIONS:   PERSONAL FACTORS: Fitness, Profession, Time since onset of injury/illness/exacerbation, and 3+ comorbidities: CHF, HTN, ORIF R ankle are also affecting patient's functional outcome.   REHAB POTENTIAL: Fair    CLINICAL DECISION  MAKING: Evolving/moderate complexity; Pain seems to be worsening based on subjective reports.   EVALUATION COMPLEXITY: Moderate   GOALS: Goals reviewed with patient? Yes  SHORT TERM GOALS: Target date: 02/06/2024  Pt will be independent with her initial HEP  to decrease back pain, improve strength, function, and ability to tolerated standing and perform standing tasks more comfortably.  Baseline: Pt has started her initial HEP (01/28/2024); 03/31/2024: Patient endorses 75% adherence Goal status: Progressing    LONG TERM GOALS: Target date: 05/26/2024   Pt will have a decrease in low back pain to 3/10 or less at worst to promote ability to perform standing tasks, pick up items from the ground, as well as negotiate stairs more comfortably.  Baseline: Low back pain: 10/10 at most for the past 3 months. (01/28/2024); 03/02/2024: 7/10 NPS  Goal status: Progressing  2.  Pt will improve B glute max and glute med strength by at least 1/2 MMT grade to promote ability to perform standing tasks, ambulate, negotiate with less back pain.  Baseline:  MMT Right eval Left eval R/L 03/31/24  Hip extension 4- 3+   Hip abduction 4- 3- 4+/4+   Goal status: INITIAL  3.  Pt will improve B hip extension PROM by at least 10 degrees to promote ability to perform standing tasks such as chores more comfortably for her back.  Baseline:  Passive  Right eval Left eval  Hip extension -7  -5   Goal status: INITIAL  4.  Pt will improve her Modified Oswestry Low Back Pain Disability Questionnaire score by at least 10% as a demonstration of improved function.  Baseline: 13/50 (26%) (01/28/2024); 03/31/2024: 16/50 Goal status: Progressing   PLAN:  PT FREQUENCY: 1-2x/week  PT DURATION: 8 weeks  PLANNED INTERVENTIONS: 97110-Therapeutic exercises, 97530- Therapeutic activity, 97112- Neuromuscular re-education, 97535- Self Care, [id number]- Manual therapy, [phone]- Aquatic Therapy, [phone]- Electrical stimulation  (unattended), [phone]- Traction (mechanical), [id number]- Ionotophoresis 4mg /ml Dexamethasone , [id number] (1-2 muscles), [id number] (3+ muscles)- Dry Needling, Patient/Family education, Joint mobilization, Spinal manipulation, and Spinal mobilization.  PLAN FOR NEXT SESSION: thoracic and hip extension, thoracic and lumbar mobility, scapular, trunk, and glute med and max strengthening, lumbopelvic and femoral control, manual techniques, modalities PRN   [name], [name] Physical Therapist- [facility] 05/19/2024, 5:35 PM  "

## 2024-05-25 ENCOUNTER — Ambulatory Visit

## 2024-05-27 ENCOUNTER — Ambulatory Visit

## 2024-05-31 ENCOUNTER — Ambulatory Visit

## 2024-06-03 ENCOUNTER — Ambulatory Visit

## 2024-06-07 ENCOUNTER — Ambulatory Visit

## 2024-06-09 ENCOUNTER — Ambulatory Visit
